# Patient Record
Sex: Female | Born: 1945 | Race: White | Hispanic: No | Marital: Single | Smoking: Former smoker
Health system: Southern US, Community
[De-identification: ages and names within clinical notes are randomized; demographics above are authoritative.]

## PROBLEM LIST (undated history)

## (undated) DIAGNOSIS — K219 Gastro-esophageal reflux disease without esophagitis: Secondary | ICD-10-CM

## (undated) DIAGNOSIS — R911 Solitary pulmonary nodule: Secondary | ICD-10-CM

## (undated) DIAGNOSIS — E119 Type 2 diabetes mellitus without complications: Secondary | ICD-10-CM

## (undated) DIAGNOSIS — J45909 Unspecified asthma, uncomplicated: Secondary | ICD-10-CM

## (undated) DIAGNOSIS — M069 Rheumatoid arthritis, unspecified: Secondary | ICD-10-CM

## (undated) DIAGNOSIS — I10 Essential (primary) hypertension: Secondary | ICD-10-CM

## (undated) HISTORY — PX: ROTATOR CUFF REPAIR: SHX139

---

## 2003-08-10 ENCOUNTER — Inpatient Hospital Stay (HOSPITAL_COMMUNITY): Admission: RE | Admit: 2003-08-10 | Discharge: 2003-08-15 | Payer: Self-pay | Admitting: Neurosurgery

## 2003-11-24 ENCOUNTER — Encounter: Admission: RE | Admit: 2003-11-24 | Discharge: 2003-11-24 | Payer: Self-pay | Admitting: Neurosurgery

## 2004-01-24 ENCOUNTER — Encounter: Admission: RE | Admit: 2004-01-24 | Discharge: 2004-01-24 | Payer: Self-pay | Admitting: Neurosurgery

## 2004-07-17 ENCOUNTER — Ambulatory Visit (HOSPITAL_COMMUNITY): Admission: RE | Admit: 2004-07-17 | Discharge: 2004-07-17 | Payer: Self-pay | Admitting: Allergy

## 2005-02-14 IMAGING — CR DG CHEST 2V
2 series · 2 of 2 positions shown · non-contrast
Comparison: 08/04/03.

CLINICAL DATA: Nonproductive cough. 
 PA AND LATERAL CHEST:

[view not recorded (1 of 2)]
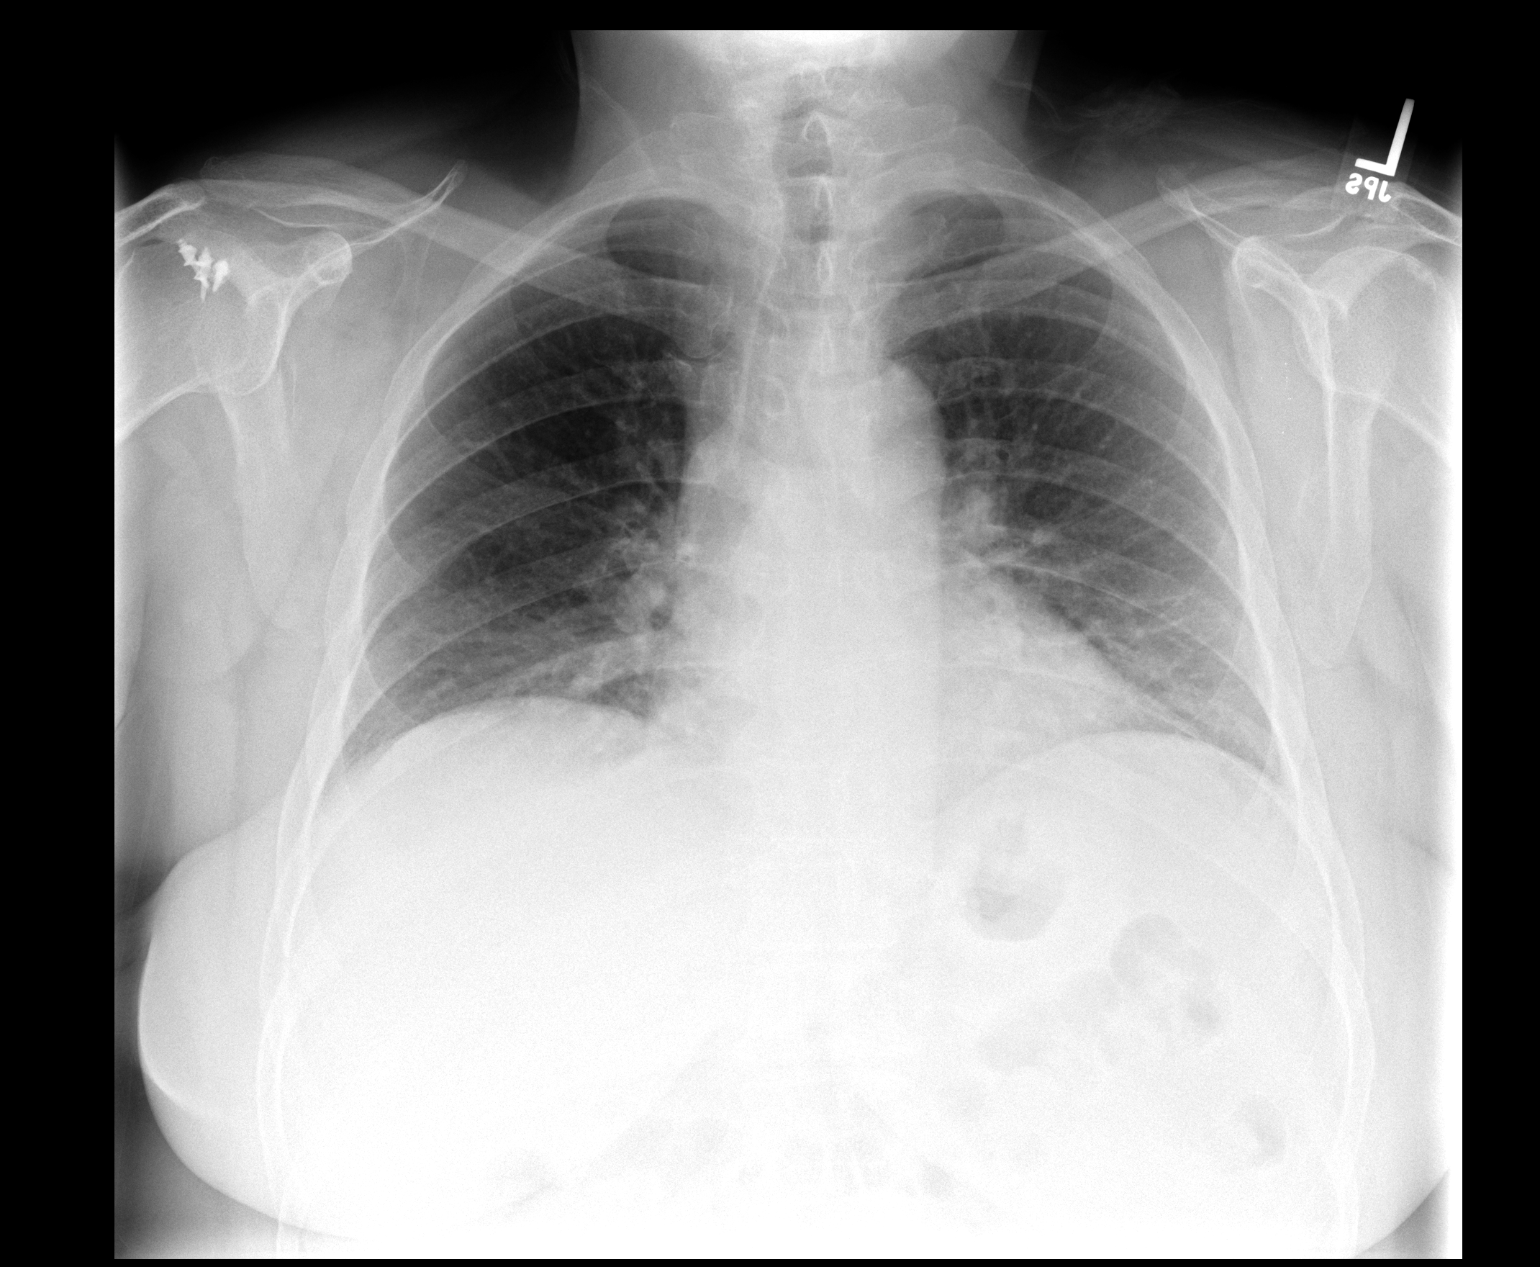

[view not recorded (2 of 2)]
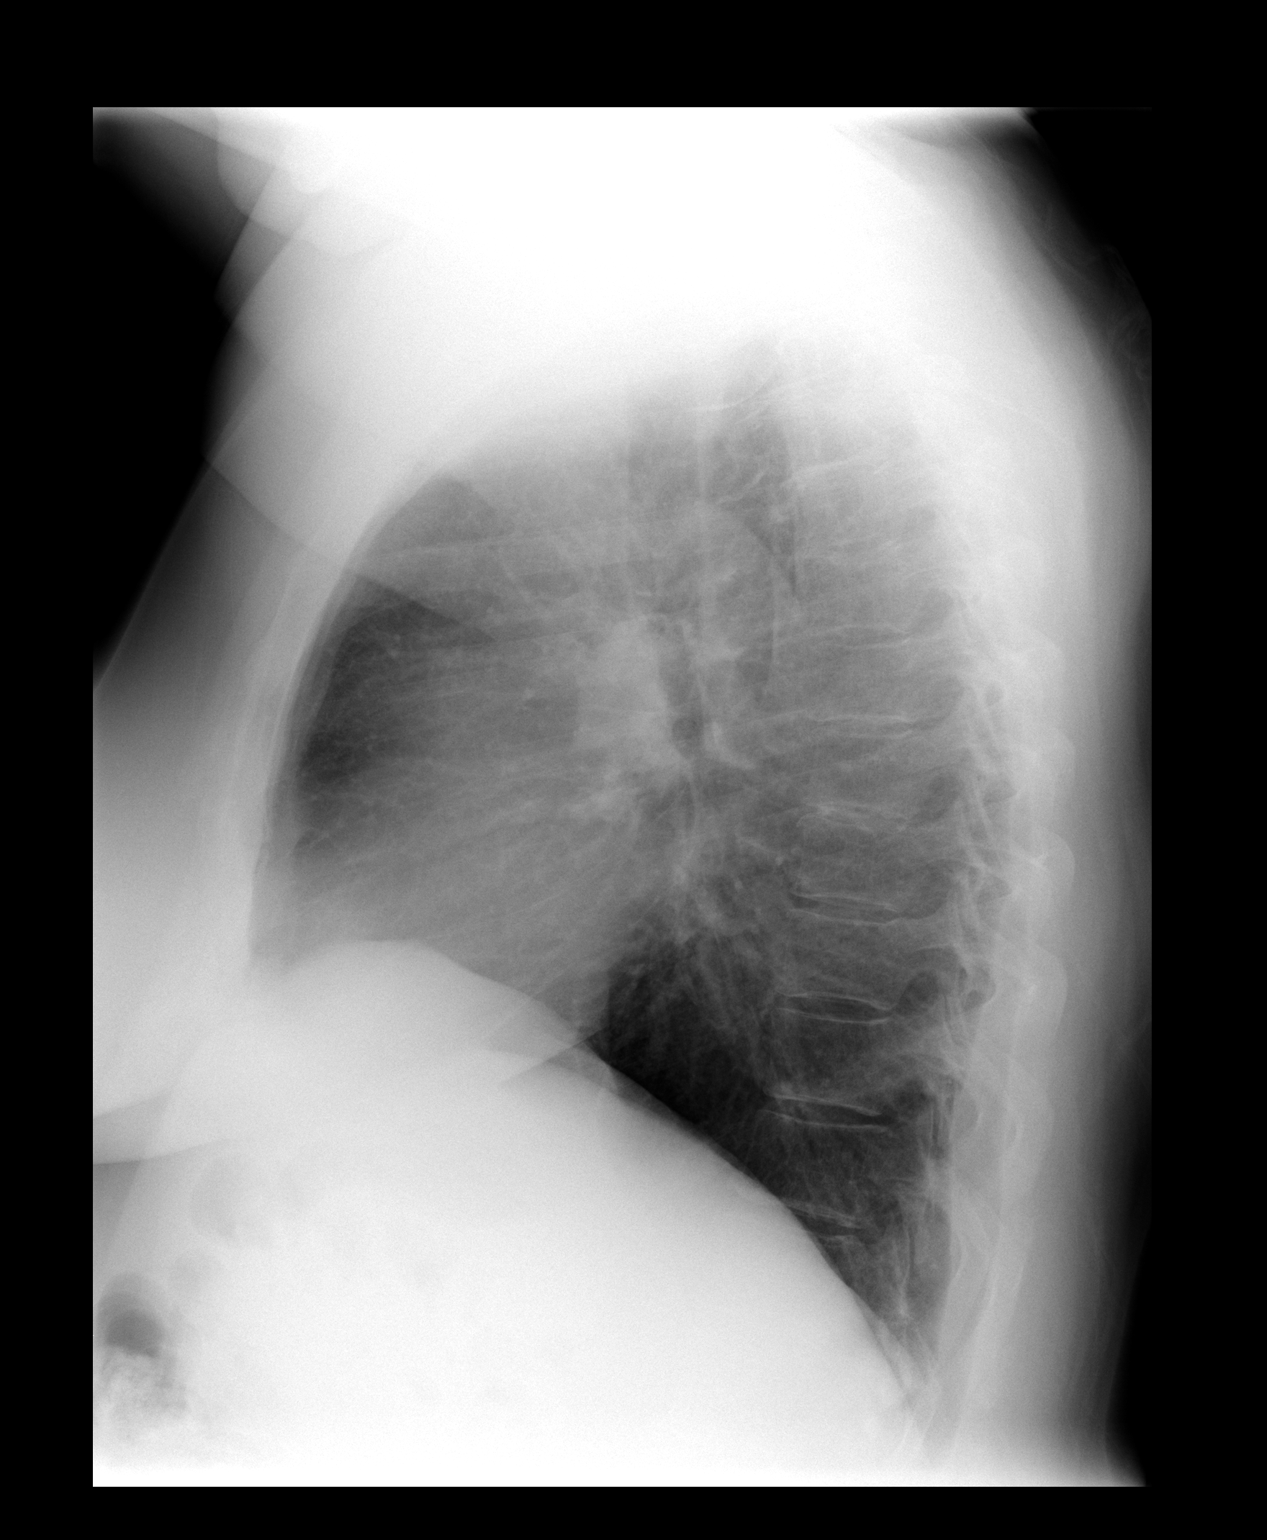

[2 of 2 positions shown; findings below may reference images not displayed]

FINDINGS: Lung volumes are low with some dependent bibasilar atelectasis.  No focal airspace disease.  No pleural effusion.  Heart size normal.
IMPRESSION: Negative exam.

## 2005-07-01 HISTORY — PX: BREAST LUMPECTOMY: SHX2

## 2005-09-04 ENCOUNTER — Ambulatory Visit: Payer: Self-pay | Admitting: Pulmonary Disease

## 2005-09-04 ENCOUNTER — Encounter: Payer: Self-pay | Admitting: Pulmonary Disease

## 2005-10-16 ENCOUNTER — Ambulatory Visit: Payer: Self-pay | Admitting: Pulmonary Disease

## 2005-11-15 ENCOUNTER — Ambulatory Visit (HOSPITAL_BASED_OUTPATIENT_CLINIC_OR_DEPARTMENT_OTHER): Admission: RE | Admit: 2005-11-15 | Discharge: 2005-11-15 | Payer: Self-pay | Admitting: Pulmonary Disease

## 2005-11-15 ENCOUNTER — Encounter: Payer: Self-pay | Admitting: Pulmonary Disease

## 2005-12-04 ENCOUNTER — Ambulatory Visit: Payer: Self-pay | Admitting: Pulmonary Disease

## 2005-12-10 ENCOUNTER — Ambulatory Visit: Payer: Self-pay | Admitting: Pulmonary Disease

## 2006-06-19 ENCOUNTER — Ambulatory Visit: Payer: Self-pay | Admitting: Pulmonary Disease

## 2006-10-14 ENCOUNTER — Encounter: Admission: RE | Admit: 2006-10-14 | Discharge: 2006-10-14 | Payer: Self-pay | Admitting: Family Medicine

## 2007-06-01 ENCOUNTER — Ambulatory Visit: Payer: Self-pay | Admitting: Pulmonary Disease

## 2007-06-03 DIAGNOSIS — I10 Essential (primary) hypertension: Secondary | ICD-10-CM

## 2007-06-03 DIAGNOSIS — G4733 Obstructive sleep apnea (adult) (pediatric): Secondary | ICD-10-CM | POA: Insufficient documentation

## 2007-06-03 DIAGNOSIS — K219 Gastro-esophageal reflux disease without esophagitis: Secondary | ICD-10-CM

## 2007-06-03 DIAGNOSIS — R05 Cough: Secondary | ICD-10-CM

## 2007-06-15 ENCOUNTER — Ambulatory Visit: Payer: Self-pay | Admitting: Pulmonary Disease

## 2007-06-30 ENCOUNTER — Ambulatory Visit: Payer: Self-pay | Admitting: Pulmonary Disease

## 2007-06-30 ENCOUNTER — Ambulatory Visit: Payer: Self-pay | Admitting: Internal Medicine

## 2007-07-03 ENCOUNTER — Telehealth (INDEPENDENT_AMBULATORY_CARE_PROVIDER_SITE_OTHER): Payer: Self-pay | Admitting: *Deleted

## 2007-07-09 ENCOUNTER — Telehealth (INDEPENDENT_AMBULATORY_CARE_PROVIDER_SITE_OTHER): Payer: Self-pay | Admitting: *Deleted

## 2007-07-14 ENCOUNTER — Ambulatory Visit: Payer: Self-pay | Admitting: Pulmonary Disease

## 2007-07-27 ENCOUNTER — Telehealth (INDEPENDENT_AMBULATORY_CARE_PROVIDER_SITE_OTHER): Payer: Self-pay | Admitting: *Deleted

## 2007-08-10 ENCOUNTER — Telehealth (INDEPENDENT_AMBULATORY_CARE_PROVIDER_SITE_OTHER): Payer: Self-pay | Admitting: *Deleted

## 2007-09-16 ENCOUNTER — Ambulatory Visit: Payer: Self-pay | Admitting: Critical Care Medicine

## 2007-09-18 ENCOUNTER — Encounter: Payer: Self-pay | Admitting: Critical Care Medicine

## 2007-10-16 ENCOUNTER — Ambulatory Visit: Payer: Self-pay | Admitting: Pulmonary Disease

## 2010-11-13 NOTE — Assessment & Plan Note (Signed)
[facility]                             PULMONARY OFFICE NOTE   Megan Golden, Megan Golden                       MRN:          [id number]  DATE:06/01/2007                            DOB:          Oct 23, 1945    HISTORY OF PRESENT ILLNESS:  The patient is a 65 year old white female  patient previously of Dr. Jayme Cloud who has a history of obstructive  sleep apnea and allergic rhinitis and cyclical cough presents today for  an acute office visit.  The patient complains over the last 3 weeks she  has had a recurrent cough, congestion, wheezing.  The patient was  initially seen at [facility], prescribed antibiotic and Tessalon Perles.  The patient did not fill her antibiotic due to a previous allergy to  chloroquinolones.  The patient reports that her symptoms slowly  improved, however, did not totally resolve.  She was subsequently seen  by Dr. [place] Callas who gave her a prednisone taper and a Depo-Medrol  injection one week ago.  Her symptoms did improve, but once she tapered  off her prednisone the symptoms started to return.  The patient  complains that she continues to have a minimally productive cough with  thick mucus, intermittent wheezing, and severe coughing paroxysms.  The  cough is worse in the daytime with talking and activity.  The patient  denies any hemoptysis, orthopnea, PND, or leg swelling.   PAST MEDICAL HISTORY:  Reviewed.   CURRENT MEDICATIONS:  Reviewed.   PHYSICAL EXAMINATION:  GENERAL:  The patient is a pleasant female in no  acute distress.  VITAL SIGNS:  She is afebrile with stable vital signs.  O2 saturation is  96% on room air.  HEENT:  Nasal mucosa with some mild erythema.  Nontender sinuses.  NECK:  Supple without cervical adenopathy.  No JVD.  RESPIRATORY:  Lung sounds are coarse breath sounds without any wheezing  or crackles.  CARDIAC:  Regular rate and rhythm.  ABDOMEN:  Soft and nontender.  EXTREMITIES:  Warm without any  edema.   Chest x-ray is pending at time of dictation.   IMPRESSION:  Slow-to-resolve tracheal bronchitis.   PLAN:  The patient is to begin Omnicef x7 days, Mucinex DM twice a day,  Tussionex #4 ounces at 1/2 teaspoon to 1 teaspoon every 12 hours as  needed for cough.  The patient is  aware of the sedating effect.  The patient is to return back to Dr.  Shelle Iron in 2 weeks or sooner if needed.      Rubye Oaks, NP  Electronically Signed      Barbaraann Share, MD,FCCP  Electronically Signed   TP/MedQ  DD: 06/01/2007  DT: 06/01/2007  Job #: [id number]

## 2010-11-16 NOTE — Discharge Summary (Signed)
NAMEEDIT, RICCIARDELLI                          ACCOUNT NO.:  [id number]   MEDICAL RECORD NO.:  [id number]                   PATIENT TYPE:  INP   LOCATION:  3008                                 FACILITY:  [facility]   PHYSICIAN:  Donalee Citrin, M.D.                     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   PROCEDURE PERFORMED:  Posterior lumbar interbody fusion, L4-5.   HOSPITAL COURSE:  The patient was admitted as an a.m. admit and went to the  operating room and underwent the aforementioned procedure.  Postoperatively,  she did very well, recovered on the floor and on the floor, was completely  afebrile with no leg pain and 5/5 strength.  She was kept flat due to  intraoperative dural leak; this was maintained flat for the 3 days  postoperatively on the third day, the patient was progressively mobilized.  On immobilization, she was having no postural headaches and no leg pain and  was subsequently discharged home on the 14th.  At the time of discharge, the  patient did not need home health and was discharged with a rolling walker  and a commode seat.  She was ambulating and voiding spontaneously and set up  for followup in 2 weeks.                                                Donalee Citrin, M.D.    GC/MEDQ  D:  08/31/2003  T:  08/31/2003  Job:  [id number]

## 2010-11-16 NOTE — Procedures (Signed)
NAMEJAVAYAH, Megan Golden NO.:  [id number]   MEDICAL RECORD NO.:  [id number]          PATIENT TYPE:  OUT   LOCATION:  SLEEP CENTER                 FACILITY:  [facility]   PHYSICIAN:  Marcelyn Bruins, M.D. [facility] DATE OF BIRTH:  1945/11/17   DATE OF STUDY:  11/15/2005                              NOCTURNAL POLYSOMNOGRAM    .  REFERRING PHYSICIAN:  Dr. Danice Goltz   INDICATIONS FOR STUDY:  Hypersomnia with sleep apnea.   EPWORTH SCORE:  24.   SLEEP ARCHITECTURE:  The patient had a total sleep time of 404 minutes with  adequate slow wave sleep as well as REM.  Sleep onset latency was mildly  prolonged at 39 minutes and REM onset was normal of 69 minutes.  Sleep  efficiency was 88%.   RESPIRATORY DATA:  The patient was found to have 56 hypopneas and 75 apneas  for a respiratory disturbance index of 20 events per hour.  The events  occurred almost exclusively during REM and also were associated with  moderate snoring.  The events were not positional in nature.   OXYGEN DATA:  There was O2 desaturation as low as 81% with the patient's  obstructive events.   CARDIAC DATA:  Cardiac decelerations and accelerations were noted with the  patient's obstructive events; however, there were no clinically significant  cardiac arrhythmias noted.   MOVEMENT/PARASOMNIA:  The patient was found to have 246 leg jerks with  almost seven per hour resulting in arousal or awakening.   IMPRESSION/RECOMMENDATIONS:  1.  Moderate obstructive sleep apnea/hypopnea syndrome with a respiratory      disturbance index of 20 events per hour and O2 desaturation as low as      81%.  The events occurred almost exclusively during REM.  Treatment for      this degree of sleep apnea may include weight loss alone if applicable,      possibly REM suppressants, upper airway surgery, oral appliance, and      also CPAP.  Clinical correlation is suggested.  2.  Cardiac decelerations and accelerations associated  with the patient's      obstructive events.  3.  Very large numbers of leg jerks with what appears to be significant      sleep disruption.  It is unclear from this study whether the leg      movements are related to the patient's sleep disordered breathing or      whether the patient      could have an additional primary movement disorder of sleep.  Clinical      correlation is suggested after appropriate treatment of the patient's      obstructive sleep apnea.           ______________________________  Marcelyn Bruins, M.D. [facility]  Diplomate, American Board of Sleep  Medicine     KC/MEDQ  D:  12/11/2005 07:53:49  T:  12/11/2005 16:10:96  Job:  [id number]

## 2010-11-16 NOTE — Op Note (Signed)
Megan Golden, HATLER                          ACCOUNT NO.:  [id number]   MEDICAL RECORD NO.:  [id number]                   PATIENT TYPE:   LOCATION:  2899                                 FACILITY:  [facility]   PHYSICIAN:  Donalee Citrin, M.D.                     DATE OF BIRTH:  12-Jun-1946   DATE OF PROCEDURE:  08/10/2003  DATE OF DISCHARGE:                                 OPERATIVE REPORT   PREOPERATIVE DIAGNOSIS:  Severe lumbar spinal stenosis L4-5 from  degenerative lumbar spondylolisthesis, L4-5 facet arthropathy and ruptured  disk L4-5.   PROCEDURE:  Decompressive laminectomy L4-5, posterior lumbar fusion L4-5  using 10 x 26 mm Tangent allograft wedges, pedicle screw fixation L4-5 using  the M10 CD [employer] system, posterolateral arthrodesis and locally  harvested autograft L4-5.   SURGEON:  Donalee Citrin, M.D.   ASSISTANT:  Tia Alert, M.D.   ANESTHESIA:  General endotracheal.   HISTORY OF PRESENT ILLNESS:  The patient is a very pleasant 65 year old  female who has had longstanding back and bilateral leg pain, worse on the  right, radiating down to her top of foot and big toe as well as the anterior  shin with numbness, tingling in the same distribution.  The patient has been  refractory to all forms of conservative treatment.  Previous films showed a  complete myelographic block at L4-5 due to spinal stenosis,  spondylolisthesis degenerative in nature, facet arthropathy and a ruptured  disk.  Due to the patient's failure to conservative treatment and her degree  of spinal stenosis and her back and leg pain the patient was recommended a  radical decompressive stabilization procedure.  After discussion of the  risks and benefits of surgery with her, she understood and agrees to  proceed.   DESCRIPTION OF PROCEDURE:  The patient was brought to the OR where she  received general anesthesia.  She was placed prone on the Wilson frame and  the back was prepped and draped in  usual sterile fashion.  Preoperative x-  ray localized the L4-5 disk space.  A midline incision was made and Bovie  electrocautery was used to dissect the subcuticular tissues.  Subperiosteal  dissection was carried out to lamina of L3, 4 and 5 bilaterally.  Intraoperative fluoroscopy confirmed localization of the L4 pedicle.  The L4  TPs were dissected free and self-retaining retractor was placed.  Then using  the Leksell rongeurs the spinous process of L4 was removed.  There was noted  to be severe facet arthropathy and spinal stenosis upon decompressive  laminectomy.  The ligament was noted to be markedly hypertrophied.  The  facets were noted to be overgrown and creating hourglass compression on the  thecal sac.  After the superior aspect of the L4 lamina had been removed and  the L4 nerve root on the right was identified, this was radically  decompressed at  its foramen.  While taking a 4 Penfield and dissecting the  hypertrophied ligament and facet complex off of the thecal sac, a dural rent  was achieved and a small little nerve rootlet hernia through the dural rent.  This was packed back into the dural rent and packed away with cottonoid.  Then attempts were taken to get around it with completing the decompressive  laminectomy on the patient's left side.  The left side L4 nerve root was  identified.  The complete medial facet complex at L4-5 on the left was  removed and the L4 nerve root was radically decompressed at its foramen.  The hypertrophied ligament and facet complex which had been noted to be  markedly stenotic as well on the left side were dissected free without dural  rent.  Then the superior aspect of the L5 lamina was removed then marched  back over to the right side.  After the L5 nerve root on the right was  identified at the pedicle it was noted to be severely stenotic up against  the pedicle.  This was dissected free with a 4 Penfield and this was worked  back  towards the site of the dural laceration.  After the facet complex had  been freed up and removed and we could see around all sides the dural rent,  it was oversewn with a 6-0 Prolene taking care to keep the rootlets packed  within the thecal sac and getting a good watertight closure with Valsalva.  Then the interspace was identified.  The disk was dissected off of the L5  nerve root and was noted to be markedly herniated and stenotic on the right  L5 nerve root.  This was felt to be consistent with pathology as well as the  severe facet arthropathy that had been compressing the fiber root and stuck  and partially eroded the dura.   Then on the left side, the interspace was identified and the epidural veins  were coagulated.  First a distractor was inserted on the left side.  The  interspace was noted to be severely collapsed and the L4 vertebral body  anterolisthesed on L5.  It took a small size cutter to break off the  posterior osteophyte in order to enable a size 10 distractor to get in  there.  Then a size 10 distractor had good apposition with the endplates,  confirmed by fluoroscopy, and the right side was then appreciated with a  D'Errico nerve retractor.  The right L5 nerve root was reflected medially  taking care to not put too much tension on the dural repair then the  interspace was incised with an 11 blade scalpel and pituitary rongeurs were  able to remove the disk space.  The disk space was prepared.  The bone graft  was a size 8 first, then 10 cutter and then size 10 chisel was used.  Fluoroscopy confirmed good depth and trajectory each step along the way.  Then a 10 x 26 mm Tangent allograft was inserted on the right side.  Then  this was noted to be 1-2 mm deep to the posterior vertebral body line.  The  distractor was removed.  Attention directed to the left side.  Disk spaces were radically cleaned out.  Several large fragments of disk were removed  from the central  compartment and the disk space was cleaned out.  There were  several herniated fragments that predominantly were central and rightward on  the left side as  well as lateral.  After the endplates were scraped with a  size 10 cutter and chisel, lateral disk removed and then central endplates  scraped with a downgoing Epstein curette, locally harvested autograft was  packed against the right side allograft and left side allograft was inserted  again 1-2 mm deep to the posterior vertebral body line, each step along the  way confirmed with fluoroscopy.  After both allografts were inserted  fluoroscopy confirmed good depth and position.   Attention directed to pedicle placement.  The L4 pedicle on the left was  identified using fluoroscopy and landmarks within the canal as well as the  transverse process drill was used to drill a pilot hole, cannulated with the  awl, tapped with a 5-5 tap, probed and a 6.5 x 45 pedicle screw inserted L4  on the left.  The pedicle was noted to be competent 360 degree rotation from  within the pedicle probing as well as within the canal.  No medial breach  was appreciated.  The L5 on the left pedicle screw was inserted in a similar  fashion as well as L4-5 on the right.  After all four pedicle screws were  inserted the wound was copiously irrigated then aggressive decortication was  carried out of the TPs and lateral gutters of L4-5 on the right and left  side.  The remainder of the locally harvested autograft was packed in the  lateral gutters then 30 mm rod was size selected and top-loading nuts were  tightening at L5 and the L4 pedicle screws compressed against L5.  The canal  was inspected.  All four nerve roots were inspected noting no further  stenosis and no graft material had migrated from the canal.  However, during  this inspection it was noted there was a small split thickness dural rent in  the inferior aspect of the L5 nerve root.  This was oversewn  again with a 6-  0 Prolene and then Duragen was used to wrap along the undersurface of the  fiber root over the top of each repair site.  Then Tisseel was overlaid on  top of this and the retractor was removed.  Meticulous hemostasis was  maintained.  The muscle and fascia reapproximated with 0 interrupted Vicryl,  subcutaneous tissues closed with 2-0 interrupted Vicryl and skin closed with  running 4-0 subcuticular.  Benzoin and Steri-Strips were applied.  The  patient was recovered from anesthesia.  At the end of the case needle counts  were correct.                                               Donalee Citrin, M.D.    GC/MEDQ  D:  08/10/2003  T:  08/10/2003  Job:  [id number]

## 2011-09-17 DIAGNOSIS — M3500 Sicca syndrome, unspecified: Secondary | ICD-10-CM | POA: Insufficient documentation

## 2011-09-17 DIAGNOSIS — H16229 Keratoconjunctivitis sicca, not specified as Sjogren's, unspecified eye: Secondary | ICD-10-CM | POA: Insufficient documentation

## 2011-09-17 DIAGNOSIS — C50919 Malignant neoplasm of unspecified site of unspecified female breast: Secondary | ICD-10-CM | POA: Insufficient documentation

## 2012-10-07 DIAGNOSIS — R0602 Shortness of breath: Secondary | ICD-10-CM | POA: Insufficient documentation

## 2014-02-28 DIAGNOSIS — M65849 Other synovitis and tenosynovitis, unspecified hand: Secondary | ICD-10-CM

## 2014-02-28 DIAGNOSIS — M19039 Primary osteoarthritis, unspecified wrist: Secondary | ICD-10-CM | POA: Insufficient documentation

## 2014-02-28 DIAGNOSIS — M65839 Other synovitis and tenosynovitis, unspecified forearm: Secondary | ICD-10-CM | POA: Insufficient documentation

## 2014-06-06 DIAGNOSIS — K219 Gastro-esophageal reflux disease without esophagitis: Secondary | ICD-10-CM | POA: Insufficient documentation

## 2015-03-23 DIAGNOSIS — M19022 Primary osteoarthritis, left elbow: Secondary | ICD-10-CM | POA: Insufficient documentation

## 2015-08-09 DIAGNOSIS — E119 Type 2 diabetes mellitus without complications: Secondary | ICD-10-CM | POA: Insufficient documentation

## 2015-08-11 ENCOUNTER — Other Ambulatory Visit: Payer: Self-pay | Admitting: Internal Medicine

## 2015-08-11 DIAGNOSIS — Z853 Personal history of malignant neoplasm of breast: Secondary | ICD-10-CM

## 2016-01-17 DIAGNOSIS — M159 Polyosteoarthritis, unspecified: Secondary | ICD-10-CM | POA: Insufficient documentation

## 2016-02-08 DIAGNOSIS — J309 Allergic rhinitis, unspecified: Secondary | ICD-10-CM | POA: Insufficient documentation

## 2016-02-19 DIAGNOSIS — M21619 Bunion of unspecified foot: Secondary | ICD-10-CM | POA: Insufficient documentation

## 2016-07-14 DIAGNOSIS — Z4789 Encounter for other orthopedic aftercare: Secondary | ICD-10-CM | POA: Insufficient documentation

## 2016-08-09 DIAGNOSIS — Z853 Personal history of malignant neoplasm of breast: Secondary | ICD-10-CM | POA: Insufficient documentation

## 2016-08-09 DIAGNOSIS — J4541 Moderate persistent asthma with (acute) exacerbation: Secondary | ICD-10-CM | POA: Insufficient documentation

## 2016-11-07 DIAGNOSIS — M549 Dorsalgia, unspecified: Secondary | ICD-10-CM

## 2016-11-07 DIAGNOSIS — G8929 Other chronic pain: Secondary | ICD-10-CM | POA: Insufficient documentation

## 2016-11-15 DIAGNOSIS — M810 Age-related osteoporosis without current pathological fracture: Secondary | ICD-10-CM | POA: Insufficient documentation

## 2016-12-18 DIAGNOSIS — E119 Type 2 diabetes mellitus without complications: Secondary | ICD-10-CM | POA: Insufficient documentation

## 2016-12-24 DIAGNOSIS — R911 Solitary pulmonary nodule: Secondary | ICD-10-CM | POA: Insufficient documentation

## 2017-02-19 DIAGNOSIS — M19041 Primary osteoarthritis, right hand: Secondary | ICD-10-CM | POA: Insufficient documentation

## 2017-05-12 DIAGNOSIS — B370 Candidal stomatitis: Secondary | ICD-10-CM | POA: Insufficient documentation

## 2017-05-12 DIAGNOSIS — H6983 Other specified disorders of Eustachian tube, bilateral: Secondary | ICD-10-CM | POA: Insufficient documentation

## 2017-07-04 DIAGNOSIS — R600 Localized edema: Secondary | ICD-10-CM | POA: Insufficient documentation

## 2017-10-23 DIAGNOSIS — M47812 Spondylosis without myelopathy or radiculopathy, cervical region: Secondary | ICD-10-CM | POA: Insufficient documentation

## 2017-11-11 DIAGNOSIS — E790 Hyperuricemia without signs of inflammatory arthritis and tophaceous disease: Secondary | ICD-10-CM | POA: Insufficient documentation

## 2017-11-11 DIAGNOSIS — M05742 Rheumatoid arthritis with rheumatoid factor of left hand without organ or systems involvement: Secondary | ICD-10-CM | POA: Insufficient documentation

## 2017-12-29 DIAGNOSIS — H43393 Other vitreous opacities, bilateral: Secondary | ICD-10-CM | POA: Insufficient documentation

## 2017-12-29 DIAGNOSIS — H43813 Vitreous degeneration, bilateral: Secondary | ICD-10-CM | POA: Insufficient documentation

## 2018-02-10 DIAGNOSIS — E1142 Type 2 diabetes mellitus with diabetic polyneuropathy: Secondary | ICD-10-CM | POA: Insufficient documentation

## 2018-03-19 ENCOUNTER — Other Ambulatory Visit: Payer: Self-pay

## 2018-03-19 ENCOUNTER — Encounter: Payer: Self-pay | Admitting: Podiatry

## 2018-03-19 ENCOUNTER — Ambulatory Visit (INDEPENDENT_AMBULATORY_CARE_PROVIDER_SITE_OTHER): Payer: Medicare Other

## 2018-03-19 ENCOUNTER — Ambulatory Visit: Payer: Medicare Other | Admitting: Podiatry

## 2018-03-19 VITALS — BP 145/71 | HR 82

## 2018-03-19 DIAGNOSIS — M2012 Hallux valgus (acquired), left foot: Secondary | ICD-10-CM

## 2018-03-19 DIAGNOSIS — E1142 Type 2 diabetes mellitus with diabetic polyneuropathy: Secondary | ICD-10-CM | POA: Diagnosis not present

## 2018-03-19 DIAGNOSIS — M2011 Hallux valgus (acquired), right foot: Secondary | ICD-10-CM | POA: Diagnosis not present

## 2018-03-19 DIAGNOSIS — M722 Plantar fascial fibromatosis: Secondary | ICD-10-CM

## 2018-03-19 DIAGNOSIS — E119 Type 2 diabetes mellitus without complications: Secondary | ICD-10-CM

## 2018-03-19 DIAGNOSIS — M2041 Other hammer toe(s) (acquired), right foot: Secondary | ICD-10-CM | POA: Diagnosis not present

## 2018-03-19 DIAGNOSIS — M2042 Other hammer toe(s) (acquired), left foot: Secondary | ICD-10-CM

## 2018-03-19 DIAGNOSIS — M792 Neuralgia and neuritis, unspecified: Secondary | ICD-10-CM

## 2018-03-19 DIAGNOSIS — E1140 Type 2 diabetes mellitus with diabetic neuropathy, unspecified: Secondary | ICD-10-CM

## 2018-03-19 MED ORDER — NONFORMULARY OR COMPOUNDED ITEM: 1.0000 g | Freq: Four times a day (QID) | 2 refills | Status: DC

## 2018-03-19 NOTE — Progress Notes (Signed)
Subjective:  Patient ID: Megan Golden, female    DOB: 13-Mar-1946,  MRN: [id number]  Chief Complaint  Patient presents with  . Foot Pain    B/L feet pain with the left worse since 1 year ago    72 y.o. female presents  for diabetic foot care. Last AMBS was 130s. Reports pain in both feet. Tips of the toes to both feet and to the left foot bunion. Hx of bunion correction left but states the area still bothers her when she wears shoes. PCP Dr. Hillery Aldo. Has tried inserts and wide shoes without relief.  Reports numbness and tingling in their feet. Denies cramping in legs and thighs.  Review of Systems: Negative except as noted in the HPI. Denies N/V/F/Ch.  No past medical history on file.  Current Outpatient Medications:  .  Abaloparatide 3120 MCG/1.56ML SOPN, Inject into the skin., Disp: , Rfl:  .  albuterol (PROVENTIL HFA;VENTOLIN HFA) 108 (90 Base) MCG/ACT inhaler, Inhale into the lungs., Disp: , Rfl:  .  allopurinol (ZYLOPRIM) 100 MG tablet, , Disp: , Rfl:  .  Ascorbic Acid (VITAMIN C PO), Take by mouth., Disp: , Rfl:  .  aspirin EC 81 MG tablet, Take 81 mg by mouth daily., Disp: , Rfl:  .  Azelastine-Fluticasone 137-50 MCG/ACT SUSP, Place into the nose., Disp: , Rfl:  .  b complex vitamins capsule, Take 1 capsule by mouth daily., Disp: , Rfl:  .  BEE POLLEN PO, Take 580 mg by mouth daily., Disp: , Rfl:  .  Calcium-Magnesium-Vitamin D (CALCIUM MAGNESIUM PO), Take by mouth. Plus [phone] mg-unit, Disp: , Rfl:  .  Colchicine 0.6 MG CAPS, Take 1 capsule by mouth daily., Disp: , Rfl: 0 .  Cyanocobalamin (VITAMIN B-12 PO), Take by mouth., Disp: , Rfl:  .  doxycycline (VIBRA-TABS) 100 MG tablet, Take 100 mg by mouth 2 (two) times daily., Disp: , Rfl: 0 .  fexofenadine (ALLEGRA) 180 MG tablet, Take by mouth., Disp: , Rfl:  .  Flaxseed, Linseed, (FLAXSEED OIL) 1000 MG CAPS, Take by mouth., Disp: , Rfl:  .  fluticasone (FLONASE) 50 MCG/ACT nasal spray, INSTILL 1 SPRAY IN EACH NOSTRIL  EVERY DAY AS NEEDED, Disp: , Rfl:  .  Fluticasone Furoate (ARNUITY ELLIPTA) 100 MCG/ACT AEPB, Inhale into the lungs., Disp: , Rfl:  .  folic acid (FOLVITE) 1 MG tablet, Take 1 mg by mouth daily., Disp: , Rfl: 0 .  furosemide (LASIX) 20 MG tablet, Take 20 mg by mouth 2 (two) times daily., Disp: , Rfl: 5 .  gabapentin (NEURONTIN) 400 MG capsule, , Disp: , Rfl:  .  glimepiride (AMARYL) 1 MG tablet, TAKE ONE TABLET BY MOUTH EVERY MORNING BEFORE BREAKFAST, Disp: , Rfl:  .  glucose blood (ONETOUCH VERIO) test strip, USE TO CHECK BLOOD SUGAR EVERY DAY, Disp: , Rfl:  .  hydroxychloroquine (PLAQUENIL) 200 MG tablet, , Disp: , Rfl:  .  losartan-hydrochlorothiazide (HYZAAR) 100-12.5 MG tablet, Take 1 tablet by mouth at bedtime., Disp: , Rfl: 2 .  metFORMIN (GLUCOPHAGE-XR) 500 MG 24 hr tablet, , Disp: , Rfl:  .  methotrexate (RHEUMATREX) 2.5 MG tablet, TAKE 4 TABLETS BY MOUTH ONCE WEEKLY, Disp: , Rfl: 0 .  metoprolol succinate (TOPROL-XL) 50 MG 24 hr tablet, TAKE ONE TABLET BY MOUTH EVERY DAY, Disp: , Rfl:  .  montelukast (SINGULAIR) 10 MG tablet, , Disp: , Rfl:  .  Multiple Vitamin (MULTIVITAMIN) capsule, Take by mouth., Disp: , Rfl:  .  omeprazole (PRILOSEC) 20  MG capsule, Take 40 mg by mouth 2 (two) times daily., Disp: , Rfl: 5 .  potassium chloride (K-DUR) 10 MEQ tablet, , Disp: , Rfl:  .  sodium chloride (OCEAN NASAL SPRAY) 0.65 % nasal spray, Place 1 spray into the nose as needed for congestion., Disp: , Rfl:  .  triamcinolone cream (KENALOG) 0.1 %, Apply 1 application topically 2 (two) times daily., Disp: , Rfl:   Social History   Tobacco Use  Smoking Status Former Smoker  Smokeless Tobacco Never Used    Allergies  Allergen Reactions  . Azithromycin Golden and Rash  . Ciprofloxacin Golden and Rash  . Hydrocodone-Acetaminophen Golden and Rash  . Levofloxacin Golden and Rash  . Hydrocodone-Acetaminophen    Objective:   Vitals:   03/19/18 0920  BP: (!) 145/71  Pulse: 82   There is no  height or weight on file to calculate BMI. Constitutional Well developed. Well nourished.  Vascular Dorsalis pedis pulses present 2+ bilaterally  Posterior tibial pulses absent bilaterally  Pedal hair growth diminished. Capillary refill normal to all digits.  No cyanosis or clubbing noted.  Neurologic Normal speech. Oriented to person, place, and time. Epicritic sensation to light touch grossly present bilaterally. Protective sensation with 5.07 monofilament  absent right, diminished 4/5 Left Vibratory sensation diminished bilaterally.  Dermatologic Nails normal length today No open wounds. No skin lesions.  Orthopedic: Normal joint ROM without pain or crepitus bilaterally. HAV bilat (R>L), digital contractures bilat. No bony tenderness.   Assessment:   1. Acquired hallux valgus of both feet   2. Hammer toes of both feet   3. DM type 2 with diabetic peripheral neuropathy ([place])   4. Encounter for diabetic foot exam ([place])   5. Neuralgia and neuritis   6. Diabetic neuropathy, painful ([place])    Plan:  Patient was evaluated and treated and all questions answered.  Diabetes with DPN, Onychomycosis -XR Reviewed. No acute fractures or dislocations. Hallux valgus right, evidence of hallux valgus correction left. Hammertoes bilat. -Educated on diabetic footcare. Diabetic risk level 1 -Will fabricate DM shoes. -Rx for compound neuropathic pain cream for symptomatic DPN -Nail care deferred today as patient just cut her nails.  Return in about 6 weeks (around 04/30/2018) for Neuritis f/u, Diabetic Foot Care.

## 2018-03-30 ENCOUNTER — Ambulatory Visit: Payer: Medicare Other | Admitting: Orthotics

## 2018-03-30 DIAGNOSIS — E119 Type 2 diabetes mellitus without complications: Secondary | ICD-10-CM

## 2018-03-30 DIAGNOSIS — M2012 Hallux valgus (acquired), left foot: Principal | ICD-10-CM

## 2018-03-30 DIAGNOSIS — M2011 Hallux valgus (acquired), right foot: Secondary | ICD-10-CM

## 2018-03-30 DIAGNOSIS — E1142 Type 2 diabetes mellitus with diabetic polyneuropathy: Secondary | ICD-10-CM

## 2018-03-30 NOTE — Progress Notes (Signed)

## 2018-04-27 ENCOUNTER — Ambulatory Visit (INDEPENDENT_AMBULATORY_CARE_PROVIDER_SITE_OTHER): Payer: Medicare Other | Admitting: Orthotics

## 2018-04-27 DIAGNOSIS — E119 Type 2 diabetes mellitus without complications: Secondary | ICD-10-CM

## 2018-04-27 DIAGNOSIS — M2011 Hallux valgus (acquired), right foot: Secondary | ICD-10-CM

## 2018-04-27 DIAGNOSIS — E1142 Type 2 diabetes mellitus with diabetic polyneuropathy: Secondary | ICD-10-CM | POA: Diagnosis not present

## 2018-04-27 DIAGNOSIS — M2041 Other hammer toe(s) (acquired), right foot: Secondary | ICD-10-CM

## 2018-04-27 DIAGNOSIS — M2012 Hallux valgus (acquired), left foot: Secondary | ICD-10-CM | POA: Diagnosis not present

## 2018-04-27 DIAGNOSIS — M2042 Other hammer toe(s) (acquired), left foot: Secondary | ICD-10-CM

## 2018-04-27 NOTE — Progress Notes (Signed)

## 2018-05-08 ENCOUNTER — Ambulatory Visit: Payer: Medicare Other | Admitting: Podiatry

## 2018-05-08 DIAGNOSIS — B351 Tinea unguium: Secondary | ICD-10-CM

## 2018-05-08 DIAGNOSIS — E1140 Type 2 diabetes mellitus with diabetic neuropathy, unspecified: Secondary | ICD-10-CM

## 2018-05-08 DIAGNOSIS — E1142 Type 2 diabetes mellitus with diabetic polyneuropathy: Secondary | ICD-10-CM

## 2018-05-08 DIAGNOSIS — L600 Ingrowing nail: Secondary | ICD-10-CM | POA: Diagnosis not present

## 2018-05-08 DIAGNOSIS — M79676 Pain in unspecified toe(s): Secondary | ICD-10-CM | POA: Diagnosis not present

## 2018-05-08 MED ORDER — NEOMYCIN-POLYMYXIN-HC 3.5-10000-1 OT SOLN: OTIC | 0 refills | Status: DC

## 2018-05-08 NOTE — Addendum Note (Signed)
Addended by: Hardie Pulley on: 05/08/2018 09:56 AM   Modules accepted: Level of Service

## 2018-05-08 NOTE — Patient Instructions (Signed)

## 2018-05-08 NOTE — Progress Notes (Addendum)
Subjective:  Patient ID: Megan Golden, female    DOB: 05/14/1946,  MRN: [id number]  Chief Complaint  Patient presents with  . Foot Pain    bilateral foot pain    72 y.o. female presents  for diabetic foot care. Nail care was deferred at last visit. Having pain in both toes from ingrown nails would like those treated today as well. Reports numbness and tingling in their feet. Denies cramping in legs and thighs.  Reports painful neuropathy didn't get the cream prescribed at last visit.  Review of Systems: Negative except as noted in the HPI. Denies N/V/F/Ch.  No past medical history on file.  Current Outpatient Medications:  .  Abaloparatide 3120 MCG/1.56ML SOPN, Inject into the skin., Disp: , Rfl:  .  albuterol (PROVENTIL HFA;VENTOLIN HFA) 108 (90 Base) MCG/ACT inhaler, Inhale into the lungs., Disp: , Rfl:  .  allopurinol (ZYLOPRIM) 100 MG tablet, , Disp: , Rfl:  .  Ascorbic Acid (VITAMIN C PO), Take by mouth., Disp: , Rfl:  .  aspirin EC 81 MG tablet, Take 81 mg by mouth daily., Disp: , Rfl:  .  Azelastine-Fluticasone 137-50 MCG/ACT SUSP, Place into the nose., Disp: , Rfl:  .  b complex vitamins capsule, Take 1 capsule by mouth daily., Disp: , Rfl:  .  BEE POLLEN PO, Take 580 mg by mouth daily., Disp: , Rfl:  .  Calcium-Magnesium-Vitamin D (CALCIUM MAGNESIUM PO), Take by mouth. Plus [phone] mg-unit, Disp: , Rfl:  .  Colchicine 0.6 MG CAPS, Take 1 capsule by mouth daily., Disp: , Rfl: 0 .  Cyanocobalamin (VITAMIN B-12 PO), Take by mouth., Disp: , Rfl:  .  doxycycline (VIBRA-TABS) 100 MG tablet, Take 100 mg by mouth 2 (two) times daily., Disp: , Rfl: 0 .  fexofenadine (ALLEGRA) 180 MG tablet, Take by mouth., Disp: , Rfl:  .  Flaxseed, Linseed, (FLAXSEED OIL) 1000 MG CAPS, Take by mouth., Disp: , Rfl:  .  fluticasone (FLONASE) 50 MCG/ACT nasal spray, INSTILL 1 SPRAY IN EACH NOSTRIL EVERY DAY AS NEEDED, Disp: , Rfl:  .  Fluticasone Furoate (ARNUITY ELLIPTA) 100 MCG/ACT AEPB,  Inhale into the lungs., Disp: , Rfl:  .  folic acid (FOLVITE) 1 MG tablet, Take 1 mg by mouth daily., Disp: , Rfl: 0 .  furosemide (LASIX) 20 MG tablet, Take 20 mg by mouth 2 (two) times daily., Disp: , Rfl: 5 .  gabapentin (NEURONTIN) 400 MG capsule, , Disp: , Rfl:  .  glimepiride (AMARYL) 1 MG tablet, TAKE ONE TABLET BY MOUTH EVERY MORNING BEFORE BREAKFAST, Disp: , Rfl:  .  glucose blood (ONETOUCH VERIO) test strip, USE TO CHECK BLOOD SUGAR EVERY DAY, Disp: , Rfl:  .  hydroxychloroquine (PLAQUENIL) 200 MG tablet, , Disp: , Rfl:  .  losartan-hydrochlorothiazide (HYZAAR) 100-12.5 MG tablet, Take 1 tablet by mouth at bedtime., Disp: , Rfl: 2 .  metFORMIN (GLUCOPHAGE-XR) 500 MG 24 hr tablet, , Disp: , Rfl:  .  methotrexate (RHEUMATREX) 2.5 MG tablet, TAKE 4 TABLETS BY MOUTH ONCE WEEKLY, Disp: , Rfl: 0 .  metoprolol succinate (TOPROL-XL) 50 MG 24 hr tablet, TAKE ONE TABLET BY MOUTH EVERY DAY, Disp: , Rfl:  .  montelukast (SINGULAIR) 10 MG tablet, , Disp: , Rfl:  .  Multiple Vitamin (MULTIVITAMIN) capsule, Take by mouth., Disp: , Rfl:  .  neomycin-polymyxin-hydrocortisone (CORTISPORIN) OTIC solution, Apply 2 drops to the ingrown toenail site twice daily. Cover with band-aid., Disp: 10 mL, Rfl: 0 .  NONFORMULARY OR COMPOUNDED  ITEM, Apply 1-2 g topically 4 (four) times daily. [facility]  Peripheral Neuropathy Cream- Bupivacaine 1%, Doxepin 3%, Gabapentin 6%, Pentoxifylline 3%, Topiramate 1% Apply 1-2 grams to affected area 3-4 times daily Qty. 120 gm 3 refills, Disp: 120 each, Rfl: 2 .  omeprazole (PRILOSEC) 20 MG capsule, Take 40 mg by mouth 2 (two) times daily., Disp: , Rfl: 5 .  potassium chloride (K-DUR) 10 MEQ tablet, , Disp: , Rfl:  .  sodium chloride (OCEAN NASAL SPRAY) 0.65 % nasal spray, Place 1 spray into the nose as needed for congestion., Disp: , Rfl:  .  triamcinolone cream (KENALOG) 0.1 %, Apply 1 application topically 2 (two) times daily., Disp: , Rfl:   Social History   Tobacco  Use  Smoking Status Former Smoker  Smokeless Tobacco Never Used    Allergies  Allergen Reactions  . Azithromycin Golden and Rash  . Ciprofloxacin Golden and Rash  . Hydrocodone-Acetaminophen Golden and Rash  . Levofloxacin Golden and Rash  . Hydrocodone-Acetaminophen    Objective:  There were no vitals filed for this visit. There is no height or weight on file to calculate BMI. Constitutional Well developed. Well nourished.  Vascular Dorsalis pedis pulses present 1+ bilaterally  Posterior tibial pulses present 1+ bilaterally  Pedal hair growth diminished. Capillary refill normal to all digits.  No cyanosis or clubbing noted.  Neurologic Normal speech. Oriented to person, place, and time. Epicritic sensation to light touch grossly present bilaterally. Protective sensation with 5.07 monofilament absent right diminished left. Vibratory sensation diminished bilaterally.  Dermatologic Nails elongated, thickened, dystrophic. No open wounds. No skin lesions.  Orthopedic: Normal joint ROM without pain or crepitus bilaterally. No visible deformities. No bony tenderness.   Assessment:   1. Ingrown nail   2. Pain around toenail   3. DM type 2 with diabetic peripheral neuropathy ([place])   4. Onychomycosis    Plan:  Patient was evaluated and treated and all questions answered.  Diabetes with PN, Onychomycosis -Educated on diabetic footcare. Diabetic risk level 1 -Nails x10 debrided sharply and manually with large nail nipper and rotary burr.   Procedure: Nail Debridement Rationale: Patient meets criteria for routine foot care due to DPN Type of Debridement: manual, sharp debridement. Instrumentation: Nail nipper, rotary burr. Number of Nails: 6   Ingrown Nail, bilaterally -Patient elects to proceed with ingrown toenail removal today -Ingrown nail excised. See procedure note. -Educated on post-procedure care including soaking. Written instructions provided. -Patient to follow  up in 2 weeks for nail check.  Procedure: Excision of Ingrown Toenail Location: Bilateral 1st toe; left bilat borders right lateral border Anesthesia: Lidocaine 1% plain; 1.46mL and Marcaine 0.5% plain; 1.32mL, digital block. Skin Prep: Betadine. Dressing: Silvadene; telfa; dry, sterile, compression dressing. Technique: Following skin prep, the toe was exsanguinated and a tourniquet was secured at the base of the toe. The affected nail border was freed, split with a nail splitter, and excised. Chemical matrixectomy was then performed with phenol and irrigated out with alcohol. The tourniquet was then removed and sterile dressing applied. Disposition: Patient tolerated procedure well. Patient to return in 2 weeks for follow-up.    Painful neuropathy -Rx compound pain cream.  Return in about 2 weeks (around 05/22/2018) for nail check.

## 2018-05-19 ENCOUNTER — Ambulatory Visit: Payer: Medicare Other

## 2018-05-19 DIAGNOSIS — L600 Ingrowing nail: Secondary | ICD-10-CM

## 2018-05-19 NOTE — Patient Instructions (Signed)

## 2018-05-20 NOTE — Progress Notes (Signed)
Patient is here today for follow-up appointment, recent procedure performed 05/08/2018, removal of bilateral ingrown toenails hallux nail both borders.  She states that overall everything is healing well and she does not have any pain at this time.  She does complain that her diabetic shoes are rubbing her foot and causing pain after her foot swells at the end of the day.  No erythema, no redness, no swelling, no drainage, no other signs and symptoms of infection.  Area is healing well and scabbing over at this time.  Discussed signs and symptoms of infection with patient.  Verbal and written instructions were given to the patient.  She is to follow-up as needed with any acute symptom changes.  I did advise her to make an appointment to see Liliane Channel about adjusting diabetic shoes.  And to not wear the shoes if they cause discomfort until she saw him.

## 2018-05-21 ENCOUNTER — Other Ambulatory Visit: Payer: Medicare Other

## 2018-05-27 ENCOUNTER — Other Ambulatory Visit: Payer: Medicare Other | Admitting: Orthotics

## 2018-06-04 ENCOUNTER — Ambulatory Visit: Payer: Medicare Other

## 2018-06-04 DIAGNOSIS — L600 Ingrowing nail: Secondary | ICD-10-CM

## 2018-06-04 NOTE — Patient Instructions (Signed)

## 2018-06-05 NOTE — Progress Notes (Signed)
Patient is here today for follow-up appointment, recent procedure performed 05/08/2018, removal of bilateral ingrown hallux nail borders.  She has stopped soaking and she states that overall the areas are healing well.  No erythema, no redness, no swelling, no drainage, no other signs symptoms of infection.  Area is healing well and scabbed over at this time.  Discussed signs and symptoms of infection with patient she is to follow-up as needed with any acute symptom changes.

## 2018-06-19 ENCOUNTER — Ambulatory Visit: Payer: Medicare Other

## 2018-06-19 DIAGNOSIS — L600 Ingrowing nail: Secondary | ICD-10-CM

## 2018-06-19 NOTE — Patient Instructions (Signed)

## 2018-07-08 NOTE — Progress Notes (Signed)
Patient is here today for follow-up appointment, recent procedure performed 05/08/2018, removal of bilateral ingrown hallux nail borders.  She has stopped soaking and she states that overall the areas are healing well.  No erythema, no redness, no swelling, no drainage, no other signs symptoms of infection.  Area is healing well and scabbed over at this time.  Discussed signs and symptoms of infection with patient she is to follow-up as needed with any acute symptom changes.

## 2018-07-09 ENCOUNTER — Ambulatory Visit: Payer: Medicare Other | Admitting: Orthotics

## 2018-07-09 DIAGNOSIS — M2041 Other hammer toe(s) (acquired), right foot: Secondary | ICD-10-CM

## 2018-07-09 DIAGNOSIS — M2011 Hallux valgus (acquired), right foot: Secondary | ICD-10-CM

## 2018-07-09 DIAGNOSIS — M2042 Other hammer toe(s) (acquired), left foot: Secondary | ICD-10-CM

## 2018-07-09 DIAGNOSIS — E1142 Type 2 diabetes mellitus with diabetic polyneuropathy: Secondary | ICD-10-CM

## 2018-07-09 DIAGNOSIS — M2012 Hallux valgus (acquired), left foot: Secondary | ICD-10-CM

## 2018-07-09 NOTE — Progress Notes (Signed)
Made adjustments to f/o by adding PPT padding to offer more c ushion at [facility].

## 2018-08-07 ENCOUNTER — Ambulatory Visit: Payer: Medicare Other | Admitting: Podiatry

## 2018-08-10 ENCOUNTER — Ambulatory Visit: Payer: Medicare Other | Admitting: Orthotics

## 2018-08-10 ENCOUNTER — Ambulatory Visit: Payer: Medicare Other | Admitting: Podiatry

## 2018-09-02 ENCOUNTER — Ambulatory Visit: Payer: Medicare Other | Admitting: Podiatry

## 2018-09-02 ENCOUNTER — Encounter: Payer: Self-pay | Admitting: Podiatry

## 2018-09-02 DIAGNOSIS — B351 Tinea unguium: Secondary | ICD-10-CM

## 2018-09-02 DIAGNOSIS — E1142 Type 2 diabetes mellitus with diabetic polyneuropathy: Secondary | ICD-10-CM | POA: Diagnosis not present

## 2018-09-02 NOTE — Patient Instructions (Signed)
Onychomycosis/Fungal Toenails  WHAT IS IT? An infection that lies within the keratin of your nail plate that is caused by a fungus.  WHY ME? Fungal infections affect all ages, sexes, races, and creeds.  There may be many factors that predispose you to a fungal infection such as age, coexisting medical conditions such as diabetes, or an autoimmune disease; stress, medications, fatigue, genetics, etc.  Bottom line: fungus thrives in a warm, moist environment and your shoes offer such a location.  IS IT CONTAGIOUS? Theoretically, yes.  You do not want to share shoes, nail clippers or files with someone who has fungal toenails.  Walking around barefoot in the same room or sleeping in the same bed is unlikely to transfer the organism.  It is important to realize, however, that fungus can spread easily from one nail to the next on the same foot.  HOW DO WE TREAT THIS?  There are several ways to treat this condition.  Treatment may depend on many factors such as age, medications, pregnancy, liver and kidney conditions, etc.  It is best to ask your doctor which options are available to you.  1. No treatment.   Unlike many other medical concerns, you can live with this condition.  However for many people this can be a painful condition and may lead to ingrown toenails or a bacterial infection.  It is recommended that you keep the nails cut short to help reduce the amount of fungal nail. 2. Topical treatment.  These range from herbal remedies to prescription strength nail lacquers.  About 40-50% effective, topicals require twice daily application for approximately 9 to 12 months or until an entirely new nail has grown out.  The most effective topicals are medical grade medications available through physicians offices. 3. Oral antifungal medications.  With an 80-90% cure rate, the most common oral medication requires 3 to 4 months of therapy and stays in your system for a year as the new nail grows out.  Oral  antifungal medications do require blood work to make sure it is a safe drug for you.  A liver function panel will be performed prior to starting the medication and after the first month of treatment.  It is important to have the blood work performed to avoid any harmful side effects.  In general, this medication safe but blood work is required. 4. Laser Therapy.  This treatment is performed by applying a specialized laser to the affected nail plate.  This therapy is noninvasive, fast, and non-painful.  It is not covered by insurance and is therefore, out of pocket.  The results have been very good with a 80-95% cure rate.  [facility] is the only practice in the area to offer this therapy. 5. Permanent Nail Avulsion.  Removing the entire nail so that a new nail will not grow back. Diabetic Neuropathy Diabetic neuropathy refers to nerve damage that is caused by diabetes (diabetes mellitus). Over time, people with diabetes can develop nerve damage throughout the body. There are several types of diabetic neuropathy:  Peripheral neuropathy. This is the most common type of diabetic neuropathy. It causes damage to nerves that carry signals between the spinal cord and other parts of the body (peripheral nerves). This usually affects nerves in the feet and legs first, and may eventually affect the hands and arms. The damage affects the ability to sense touch or temperature.  Autonomic neuropathy. This type causes damage to nerves that control involuntary functions (autonomic nerves). These  nerves carry signals that control: ? Heartbeat. ? Body temperature. ? Blood pressure. ? Urination. ? Digestion. ? Sweating. ? Sexual function. ? Response to changing blood sugar (glucose) levels.  Focal neuropathy. This type of nerve damage affects one area of the body, such as an arm, a leg, or the face. The injury may involve one nerve or a small group of nerves. Focal neuropathy can be painful and  unpredictable, and occurs most often in older adults with diabetes. This often develops suddenly, but usually improves over time and does not cause long-term problems.  Proximal neuropathy. This type of nerve damage affects the nerves of the thighs, hips, buttocks, or legs. It causes severe pain, weakness, and muscle death (atrophy), usually in the thigh muscles. It is more common among older men and people who have type 2 diabetes. The length of recovery time may vary. What are the causes? Peripheral, autonomic, and focal neuropathies are caused by diabetes that is not well controlled with treatment. The cause of proximal neuropathy is not known, but it may be caused by inflammation related to uncontrolled blood glucose levels. What are the signs or symptoms? Peripheral neuropathy Peripheral neuropathy develops slowly over time. When the nerves of the feet and legs no longer work, you may experience:  Burning, stabbing, or aching pain in the legs or feet.  Pain or cramping in the legs or feet.  Loss of feeling (numbness) and inability to feel pressure or pain in the feet. This can lead to: ? Thick calluses or sores on areas of constant pressure. ? Ulcers. ? Reduced ability to feel temperature changes.  Foot deformities.  Muscle weakness.  Loss of balance or coordination. Autonomic neuropathy The symptoms of autonomic neuropathy vary depending on which nerves are affected. Symptoms may include:  Problems with digestion, such as: ? Nausea or vomiting. ? Poor appetite. ? Bloating. ? Diarrhea or constipation. ? Trouble swallowing. ? Losing weight without trying to.  Problems with the heart, blood and lungs, such as: ? Dizziness, especially when standing up. ? Fainting. ? Shortness of breath. ? Irregular heartbeat.  Bladder problems, such as: ? Trouble starting or stopping urination. ? Leaking urine. ? Trouble emptying the bladder. ? Urinary tract infections  (UTIs).  Problems with other body functions, such as: ? Sweat. You may sweat too much or too little. ? Temperature. You might get hot easily. Or, you might feel cold more than usual. ? Sexual function. Men may not be able to get or maintain an erection. Women may have vaginal dryness and difficulty with arousal. Focal neuropathy Symptoms affect only one area of the body. Common symptoms include:  Numbness.  Tingling.  Burning pain.  Prickling feeling.  Very sensitive skin.  Weakness.  Inability to move (paralysis).  Muscle twitching.  Muscles getting smaller (wasting).  Poor coordination.  Double or blurred vision. Proximal neuropathy  Sudden, severe pain in the hip, thigh, or buttocks. Pain may spread from the back into the legs (sciatica).  Pain and numbness in the arms and legs.  Tingling.  Loss of bladder control or bowel control.  Weakness and wasting of thigh muscles.  Difficulty getting up from a seated position.  Abdominal swelling.  Unexplained weight loss. How is this diagnosed? Diagnosis usually involves reviewing your medical history and any symptoms you have. Diagnosis varies depending on the type of neuropathy your health care provider suspects. Peripheral neuropathy Your health care provider will check areas that are affected by your nervous system (neurologic exam),  such as your reflexes, how you move, and what you can feel. You may have other tests, such as:  Blood tests.  Removal and examination of fluid that surrounds the spinal cord (lumbar puncture).  CT scan.  MRI.  A test to check the nerves that control muscles (electromyogram, EMG).  Tests of how quickly messages pass through your nerves (nerve conduction velocity tests).  Removal of a small piece of nerve to be examined under a microscope (biopsy). Autonomic neuropathy You may have tests, such as:  Tests to measure your blood pressure and heart rate. This may include  monitoring you while you are safely secured to an exam table that moves you from a lying position to an upright position (table tilt test).  Breathing tests to check your lungs.  Tests to check how food moves through the digestive system (gastric emptying tests).  Blood, sweat, or urine tests.  Ultrasound of your bladder.  Spinal fluid tests. Focal neuropathy This condition may be diagnosed with:  A neurologic exam.  CT scan.  MRI.  EMG.  Nerve conduction velocity tests. Proximal neuropathy There is no test to diagnose this type of neuropathy. You may have tests to rule out other possible causes of this type of neuropathy. Tests may include:  X-rays of your spine and lumbar region.  Lumbar puncture.  MRI. How is this treated? The goal of treatment is to keep nerve damage from getting worse. The most important part of treatment is keeping your blood glucose level and your A1C level within your target range by following your diabetes management plan. Over time, maintaining lower blood glucose levels helps lessen symptoms. In some cases, you may need prescription pain medicine. Follow these instructions at home:  Lifestyle   Do not use any products that contain nicotine or tobacco, such as cigarettes and e-cigarettes. If you need help quitting, ask your health care provider.  Be physically active every day. Include strength training and balance exercises.  Follow a healthy meal plan.  Work with your health care provider to manage your blood pressure. General instructions  Follow your diabetes management plan as directed. ? Check your blood glucose levels as directed by your health care provider. ? Keep your blood glucose in your target range as directed by your health care provider. ? Have your A1C level checked at least two times a year, or as often as told by your health care provider.  Take over the counter and prescription medicines only as told by your health  care provider. This includes insulin and diabetes medicine.  Do not drive or use heavy machinery while taking prescription pain medicines.  Check your skin and feet every day for cuts, bruises, redness, blisters, or sores.  Keep all follow up visits as told by your health care provider. This is important. Contact a health care provider if:  You have burning, stabbing, or aching pain in your legs or feet.  You are unable to feel pressure or pain in your feet.  You develop problems with digestion, such as: ? Nausea. ? Vomiting. ? Bloating. ? Constipation. ? Diarrhea. ? Abdominal pain.  You have difficulty with urination, such as inability: ? To control when you urinate (incontinence). ? To completely empty the bladder (retention).  You have palpitations.  You feel dizzy, weak, or faint when you stand up. Get help right away if:  You cannot urinate.  You have sudden weakness or loss of coordination.  You have trouble speaking.  You have  pain or pressure in your chest.  You have an irregular heart beat.  You have sudden inability to move a part of your body. Summary  Diabetic neuropathy refers to nerve damage that is caused by diabetes. It can affect nerves throughout the entire body, causing numbness and pain in the arms, legs, digestive tract, heart, and other body systems.  Keep your blood glucose level and your blood pressure in your target range, as directed by your health care provider. This can help prevent neuropathy from getting worse.  Check your skin and feet every day for cuts, bruises, redness, blisters, or sores.  Do not use any products that contain nicotine or tobacco, such as cigarettes and e-cigarettes. If you need help quitting, ask your health care provider. This information is not intended to replace advice given to you by your health care provider. Make sure you discuss any questions you have with your health care provider. Document Released:  08/26/2001 Document Revised: 07/30/2017 Document Reviewed: 07/22/2016 [employer]vier Interactive Patient Education  2019 [employer].

## 2018-09-02 NOTE — Progress Notes (Signed)
Subjective: Megan Golden presents today with history of diabetic neuropathy with cc of painful, mycotic toenails.  Pain is aggravated when wearing enclosed shoe gear and relieved with periodic professional debridement.  Patient has peripheral neuropathy managed with gabapentin.  Reita Cliche, MD is her PCP.  Last visit May 21, 2018.   States she is continued to have increasing pain in both of her feet.  She relates burning from the bunion areas across the top of her forefoot area of both feet.  She relates she is unable to wear her shoes comfortably and this is all interfering with her daily activities.  She relates that neuropathy pain cream she received on last visit did not relieve her pain.  Patient also states that she gets swelling of her left foot during the day and it is difficult to wear her diabetic shoes.  She denies any calf pain.   Current Outpatient Medications:  .  Abaloparatide 3120 MCG/1.56ML SOPN, Inject into the skin., Disp: , Rfl:  .  albuterol (PROVENTIL HFA;VENTOLIN HFA) 108 (90 Base) MCG/ACT inhaler, Inhale into the lungs., Disp: , Rfl:  .  allopurinol (ZYLOPRIM) 100 MG tablet, , Disp: , Rfl:  .  Ascorbic Acid (VITAMIN C PO), Take by mouth., Disp: , Rfl:  .  aspirin EC 81 MG tablet, Take 81 mg by mouth daily., Disp: , Rfl:  .  atorvastatin (LIPITOR) 10 MG tablet, Take 5 mg by mouth daily., Disp: , Rfl: 11 .  Azelastine-Fluticasone 137-50 MCG/ACT SUSP, Place into the nose., Disp: , Rfl:  .  b complex vitamins capsule, Take 1 capsule by mouth daily., Disp: , Rfl:  .  BEE POLLEN PO, Take 580 mg by mouth daily., Disp: , Rfl:  .  Calcium-Magnesium-Vitamin D (CALCIUM MAGNESIUM PO), Take by mouth. Plus [phone] mg-unit, Disp: , Rfl:  .  Colchicine 0.6 MG CAPS, Take 1 capsule by mouth daily., Disp: , Rfl: 0 .  Cyanocobalamin (VITAMIN B-12 PO), Take by mouth., Disp: , Rfl:  .  fexofenadine (ALLEGRA) 180 MG tablet, Take by mouth., Disp: , Rfl:  .  Flaxseed,  Linseed, (FLAXSEED OIL) 1000 MG CAPS, Take by mouth., Disp: , Rfl:  .  fluticasone (FLONASE) 50 MCG/ACT nasal spray, INSTILL 1 SPRAY IN EACH NOSTRIL EVERY DAY AS NEEDED, Disp: , Rfl:  .  Fluticasone Furoate (ARNUITY ELLIPTA) 100 MCG/ACT AEPB, Inhale into the lungs., Disp: , Rfl:  .  folic acid (FOLVITE) 1 MG tablet, Take 1 mg by mouth daily., Disp: , Rfl: 0 .  furosemide (LASIX) 20 MG tablet, Take 20 mg by mouth 2 (two) times daily., Disp: , Rfl: 5 .  gabapentin (NEURONTIN) 400 MG capsule, , Disp: , Rfl:  .  glimepiride (AMARYL) 1 MG tablet, TAKE ONE TABLET BY MOUTH EVERY MORNING BEFORE BREAKFAST, Disp: , Rfl:  .  glucose blood (ONETOUCH VERIO) test strip, USE TO CHECK BLOOD SUGAR EVERY DAY, Disp: , Rfl:  .  hydroxychloroquine (PLAQUENIL) 200 MG tablet, , Disp: , Rfl:  .  losartan-hydrochlorothiazide (HYZAAR) 100-12.5 MG tablet, Take 1 tablet by mouth at bedtime., Disp: , Rfl: 2 .  metFORMIN (GLUCOPHAGE-XR) 500 MG 24 hr tablet, , Disp: , Rfl:  .  methotrexate (RHEUMATREX) 2.5 MG tablet, TAKE 4 TABLETS BY MOUTH ONCE WEEKLY, Disp: , Rfl: 0 .  metoprolol succinate (TOPROL-XL) 50 MG 24 hr tablet, TAKE ONE TABLET BY MOUTH EVERY DAY, Disp: , Rfl:  .  montelukast (SINGULAIR) 10 MG tablet, , Disp: , Rfl:  .  Multiple  Vitamin (MULTIVITAMIN) capsule, Take by mouth., Disp: , Rfl:  .  NONFORMULARY OR COMPOUNDED ITEM, Apply 1-2 g topically 4 (four) times daily. [facility]  Peripheral Neuropathy Cream- Bupivacaine 1%, Doxepin 3%, Gabapentin 6%, Pentoxifylline 3%, Topiramate 1% Apply 1-2 grams to affected area 3-4 times daily Qty. 120 gm 3 refills, Disp: 120 each, Rfl: 2 .  omeprazole (PRILOSEC) 20 MG capsule, Take 40 mg by mouth 2 (two) times daily., Disp: , Rfl: 5 .  potassium chloride (K-DUR) 10 MEQ tablet, , Disp: , Rfl:  .  potassium chloride (K-DUR,KLOR-CON) 10 MEQ tablet, Take by mouth., Disp: , Rfl:  .  sodium chloride (OCEAN NASAL SPRAY) 0.65 % nasal spray, Place 1 spray into the nose as needed  for congestion., Disp: , Rfl:  .  triamcinolone cream (KENALOG) 0.1 %, Apply 1 application topically 2 (two) times daily., Disp: , Rfl:   Allergies  Allergen Reactions  . Azithromycin Golden and Rash  . Ciprofloxacin Golden and Rash  . Hydrocodone-Acetaminophen Golden and Rash  . Levofloxacin Golden and Rash  . Hydrocodone-Acetaminophen     Objective:  Vascular Examination: Capillary refill time immediate x 10 digits.  Dorsalis pedis and Posterior tibial pulses palpable bilaterally.  Digital hair x 10 digits was diminished.  Skin temperature gradient WNL b/l.  There is no lower extremity edema noted today.  No pain with calf compression bilaterally.  Dermatological Examination: Skin with normal turgor, texture and tone b/l.  Toenails 1-5 b/l discolored, thick, dystrophic with subungual debris and pain with palpation to nailbeds due to thickness of nails.  No open wounds.  No interdigital macerations noted.  Musculoskeletal: Muscle strength 5/5 to all muscle groups b/l.  Hallux abductovalgus with bunion deformity right greater than left.  Mild digital contractures noted bilaterally digits 2 through 5.  Neurological: Sensation with 10 gram monofilament is intact bilaterally.    Assessment: 1. Painful onychomycosis toenails 1-5 b/l 2. NIDDM with neuropathy  Plan: 1. Toenails 1-5 b/l were debrided in length and girth without iatrogenic bleeding. 2. I educated her on not tying her shoelaces too tight.  At times, when shoelaces are tied too tight they can cause a neuritis on the dorsal aspect of the feet.  I loosened her shoestrings and her shoes on today patient related understanding. 3. We discussed her neuropathy symptoms on today.  I have advised her to call her PCP and appointment with that her current dose of gabapentin is not providing pain relief for her.  I have asked her to apply the neuropathy pain cream to her feet before bedtime. 4. For her pedal edema I have  prescribed compression knee-highs 20 to 30 mmHg.  She is to apply every morning and remove every evening.  The prescription was written for [facility]. 5. Patient to continue soft, supportive shoe gear daily. 6. Patient to report any pedal injuries to medical professional immediately. 7. Follow up 3 months.  8. Patient/POA to call should there be a concern in the interim.

## 2018-09-29 ENCOUNTER — Emergency Department (HOSPITAL_COMMUNITY): Payer: Medicare Other

## 2018-09-29 ENCOUNTER — Encounter (HOSPITAL_COMMUNITY): Payer: Self-pay | Admitting: Emergency Medicine

## 2018-09-29 ENCOUNTER — Inpatient Hospital Stay (HOSPITAL_COMMUNITY)
Admission: EM | Admit: 2018-09-29 | Discharge: 2018-10-02 | DRG: 177 | Disposition: A | Discharge status: Home / Self Care | Payer: Medicare Other | Attending: Internal Medicine | Admitting: Internal Medicine

## 2018-09-29 DIAGNOSIS — M05742 Rheumatoid arthritis with rheumatoid factor of left hand without organ or systems involvement: Secondary | ICD-10-CM

## 2018-09-29 DIAGNOSIS — R6889 Other general symptoms and signs: Secondary | ICD-10-CM

## 2018-09-29 DIAGNOSIS — Z20822 Contact with and (suspected) exposure to covid-19: Secondary | ICD-10-CM

## 2018-09-29 DIAGNOSIS — Z881 Allergy status to other antibiotic agents status: Secondary | ICD-10-CM

## 2018-09-29 DIAGNOSIS — I10 Essential (primary) hypertension: Secondary | ICD-10-CM | POA: Diagnosis not present

## 2018-09-29 DIAGNOSIS — Y92230 Patient room in hospital as the place of occurrence of the external cause: Secondary | ICD-10-CM | POA: Diagnosis not present

## 2018-09-29 DIAGNOSIS — K219 Gastro-esophageal reflux disease without esophagitis: Secondary | ICD-10-CM | POA: Diagnosis present

## 2018-09-29 DIAGNOSIS — Z853 Personal history of malignant neoplasm of breast: Secondary | ICD-10-CM | POA: Diagnosis not present

## 2018-09-29 DIAGNOSIS — Z825 Family history of asthma and other chronic lower respiratory diseases: Secondary | ICD-10-CM

## 2018-09-29 DIAGNOSIS — Z87891 Personal history of nicotine dependence: Secondary | ICD-10-CM

## 2018-09-29 DIAGNOSIS — M05741 Rheumatoid arthritis with rheumatoid factor of right hand without organ or systems involvement: Secondary | ICD-10-CM | POA: Diagnosis not present

## 2018-09-29 DIAGNOSIS — Z8262 Family history of osteoporosis: Secondary | ICD-10-CM

## 2018-09-29 DIAGNOSIS — E1142 Type 2 diabetes mellitus with diabetic polyneuropathy: Secondary | ICD-10-CM | POA: Diagnosis present

## 2018-09-29 DIAGNOSIS — Z8249 Family history of ischemic heart disease and other diseases of the circulatory system: Secondary | ICD-10-CM | POA: Diagnosis not present

## 2018-09-29 DIAGNOSIS — B9729 Other coronavirus as the cause of diseases classified elsewhere: Secondary | ICD-10-CM

## 2018-09-29 DIAGNOSIS — Z7951 Long term (current) use of inhaled steroids: Secondary | ICD-10-CM | POA: Diagnosis not present

## 2018-09-29 DIAGNOSIS — Z833 Family history of diabetes mellitus: Secondary | ICD-10-CM | POA: Diagnosis not present

## 2018-09-29 DIAGNOSIS — J1289 Other viral pneumonia: Secondary | ICD-10-CM | POA: Diagnosis present

## 2018-09-29 DIAGNOSIS — T378X5A Adverse effect of other specified systemic anti-infectives and antiparasitics, initial encounter: Secondary | ICD-10-CM | POA: Diagnosis not present

## 2018-09-29 DIAGNOSIS — M47812 Spondylosis without myelopathy or radiculopathy, cervical region: Secondary | ICD-10-CM | POA: Diagnosis present

## 2018-09-29 DIAGNOSIS — M810 Age-related osteoporosis without current pathological fracture: Secondary | ICD-10-CM | POA: Diagnosis present

## 2018-09-29 DIAGNOSIS — Z885 Allergy status to narcotic agent status: Secondary | ICD-10-CM | POA: Diagnosis not present

## 2018-09-29 DIAGNOSIS — R110 Nausea: Secondary | ICD-10-CM | POA: Diagnosis not present

## 2018-09-29 DIAGNOSIS — Z7982 Long term (current) use of aspirin: Secondary | ICD-10-CM

## 2018-09-29 DIAGNOSIS — M069 Rheumatoid arthritis, unspecified: Secondary | ICD-10-CM | POA: Diagnosis present

## 2018-09-29 DIAGNOSIS — J069 Acute upper respiratory infection, unspecified: Secondary | ICD-10-CM | POA: Diagnosis not present

## 2018-09-29 DIAGNOSIS — E119 Type 2 diabetes mellitus without complications: Secondary | ICD-10-CM

## 2018-09-29 DIAGNOSIS — Z79899 Other long term (current) drug therapy: Secondary | ICD-10-CM

## 2018-09-29 DIAGNOSIS — J45909 Unspecified asthma, uncomplicated: Secondary | ICD-10-CM | POA: Diagnosis present

## 2018-09-29 HISTORY — DX: Unspecified asthma, uncomplicated: J45.909

## 2018-09-29 HISTORY — DX: Rheumatoid arthritis, unspecified: M06.9

## 2018-09-29 HISTORY — DX: Gastro-esophageal reflux disease without esophagitis: K21.9

## 2018-09-29 HISTORY — DX: Solitary pulmonary nodule: R91.1

## 2018-09-29 HISTORY — DX: Type 2 diabetes mellitus without complications: E11.9

## 2018-09-29 HISTORY — DX: Essential (primary) hypertension: I10

## 2018-09-29 LAB — D-DIMER, QUANTITATIVE: D-Dimer, Quant: 1.51 ug/mL-FEU — ABNORMAL HIGH (ref 0.00–0.50)

## 2018-09-29 LAB — CBC WITH DIFFERENTIAL/PLATELET
Abs Immature Granulocytes: 0 10*3/uL (ref 0.00–0.07)
BAND NEUTROPHILS: 1 %
BASOS PCT: 0 %
Basophils Absolute: 0 10*3/uL (ref 0.0–0.1)
Eosinophils Absolute: 0 10*3/uL (ref 0.0–0.5)
Eosinophils Relative: 0 %
HEMATOCRIT: 34.5 % — AB (ref 36.0–46.0)
Hemoglobin: 11.6 g/dL — ABNORMAL LOW (ref 12.0–15.0)
LYMPHS PCT: 11 %
Lymphs Abs: 0.9 10*3/uL (ref 0.7–4.0)
MCH: 33.6 pg (ref 26.0–34.0)
MCHC: 33.6 g/dL (ref 30.0–36.0)
MCV: 100 fL (ref 80.0–100.0)
Monocytes Absolute: 0.1 10*3/uL (ref 0.1–1.0)
Monocytes Relative: 1 %
Neutro Abs: 7.1 10*3/uL (ref 1.7–7.7)
Neutrophils Relative %: 87 %
Platelets: 299 10*3/uL (ref 150–400)
RBC: 3.45 MIL/uL — ABNORMAL LOW (ref 3.87–5.11)
RDW: 13.2 % (ref 11.5–15.5)
WBC: 8.1 10*3/uL (ref 4.0–10.5)
nRBC: 0 % (ref 0.0–0.2)

## 2018-09-29 LAB — COMPREHENSIVE METABOLIC PANEL
ALT: 33 U/L (ref 0–44)
AST: 52 U/L — ABNORMAL HIGH (ref 15–41)
Albumin: 3.1 g/dL — ABNORMAL LOW (ref 3.5–5.0)
Alkaline Phosphatase: 51 U/L (ref 38–126)
Anion gap: 13 (ref 5–15)
BUN: 16 mg/dL (ref 8–23)
CHLORIDE: 98 mmol/L (ref 98–111)
CO2: 22 mmol/L (ref 22–32)
Calcium: 7.7 mg/dL — ABNORMAL LOW (ref 8.9–10.3)
Creatinine, Ser: 0.98 mg/dL (ref 0.44–1.00)
GFR calc Af Amer: >60 mL/min (ref >60)
GFR calc non Af Amer: 57 mL/min — ABNORMAL LOW (ref >60)
Glucose, Bld: 160 mg/dL — ABNORMAL HIGH (ref 70–99)
Potassium: 4 mmol/L (ref 3.5–5.1)
SODIUM: 133 mmol/L — AB (ref 135–145)
Total Bilirubin: 0.8 mg/dL (ref 0.3–1.2)
Total Protein: 7.5 g/dL (ref 6.5–8.1)

## 2018-09-29 LAB — LACTIC ACID, PLASMA: Lactic Acid, Venous: 1.7 mmol/L (ref 0.5–1.9)

## 2018-09-29 MED ORDER — HYDROXYCHLOROQUINE SULFATE 200 MG PO TABS
200.0000 mg | ORAL_TABLET | Freq: Two times a day (BID) | ORAL | Status: DC
  Administered 2018-09-30 – 2018-10-01 (×3): 200 mg via ORAL
  Filled 2018-09-29 (×5): qty 1

## 2018-09-29 MED ORDER — ENOXAPARIN SODIUM 40 MG/0.4ML ~~LOC~~ SOLN
40.0000 mg | Freq: Every day | SUBCUTANEOUS | Status: DC
  Administered 2018-09-30 – 2018-10-02 (×3): 40 mg via SUBCUTANEOUS
  Filled 2018-09-29 (×4): qty 0.4

## 2018-09-29 MED ORDER — ACETAMINOPHEN 325 MG PO TABS
650.0000 mg | ORAL_TABLET | Freq: Four times a day (QID) | ORAL | Status: DC | PRN
  Administered 2018-09-30 – 2018-10-02 (×3): 650 mg via ORAL
  Filled 2018-09-29 (×3): qty 2

## 2018-09-29 MED ORDER — SODIUM CHLORIDE 0.9 % IV BOLUS (SEPSIS)
1000.0000 mL | Freq: Once | INTRAVENOUS | Status: AC
  Administered 2018-09-29: 1000 mL via INTRAVENOUS

## 2018-09-29 MED ORDER — ACETAMINOPHEN 325 MG PO TABS
650.0000 mg | ORAL_TABLET | Freq: Once | ORAL | Status: AC
  Administered 2018-09-29: 650 mg via ORAL
  Filled 2018-09-29: qty 2

## 2018-09-29 MED ORDER — CALCIUM GLUCONATE-NACL 1-0.675 GM/50ML-% IV SOLN
1.0000 g | Freq: Once | INTRAVENOUS | Status: AC
  Administered 2018-09-30: 1000 mg via INTRAVENOUS
  Filled 2018-09-29: qty 50

## 2018-09-29 MED ORDER — INSULIN ASPART 100 UNIT/ML ~~LOC~~ SOLN
0.0000 [IU] | Freq: Three times a day (TID) | SUBCUTANEOUS | Status: DC
  Administered 2018-09-30 – 2018-10-02 (×3): 2 [IU] via SUBCUTANEOUS

## 2018-09-29 MED ORDER — HYDROXYCHLOROQUINE SULFATE 200 MG PO TABS
400.0000 mg | ORAL_TABLET | Freq: Two times a day (BID) | ORAL | Status: AC
  Administered 2018-09-30 (×2): 400 mg via ORAL
  Filled 2018-09-29 (×2): qty 2

## 2018-09-29 MED ORDER — ONDANSETRON HCL 4 MG/2ML IJ SOLN
4.0000 mg | Freq: Once | INTRAMUSCULAR | Status: AC
  Administered 2018-09-29: 4 mg via INTRAVENOUS
  Filled 2018-09-29: qty 2

## 2018-09-29 NOTE — ED Notes (Signed)
Bed: DJ24 Expected date:  Expected time:  Means of arrival:  Comments: 73 yr old female, COVID exposure

## 2018-09-29 NOTE — H&P (Signed)
History and Physical    Megan Golden NAT:[id number] DOB: [birth year] DOA: 09/29/2018  PCP: Reita Cliche, MD  Patient coming from: Home  I have personally briefly reviewed patient's old medical records in [place]  Chief Complaint: Cough, SOB  HPI: Megan Golden is a 73 y.o. female with medical history significant of DM2, HTN, RA on MTX.  Patient has been feeling ill with cough, SOB, diarrhea, body aches, fever.  Symptoms ongoing since around the 19th.  Persistent / worsening over the past week.  Saw PCP on Oct 21, 2022 who was concerned for COVID 19.  Influenza test done there was neg.  CXR showed multifocal PNA.  Patient put on Augmentin.  Results of patients COVID-19 test not yet known.  Patients brother-in-law (whom she lives with) also became sick.  Was admitted to [facility], and passed away on ventilator.  His COVID-19 testing has since come back positive!  Despite treatment she says her SOB symptoms have continued to progressively worsen and she presents to the ED today.   ED Course: Tm 102.x.  Not needing O2 at this time.  CXR shows progressive B multifocal PNA.   Review of Systems: As per HPI otherwise 10 point review of systems negative.   Past Medical History:  Diagnosis Date  . Asthma   . DM2 (diabetes mellitus, type 2) ([place])   . GERD (gastroesophageal reflux disease)   . HTN (hypertension)   . Pulmonary nodule   . RA (rheumatoid arthritis) ([facility])    RF positive    Past Surgical History:  Procedure Laterality Date  . BREAST LUMPECTOMY Left 2007     reports that she has quit smoking. She has never used smokeless tobacco. She reports previous alcohol use. She reports that she does not use drugs.  Allergies  Allergen Reactions  . Azithromycin Hives and Rash  . Ciprofloxacin Hives and Rash  . Hydrocodone-Acetaminophen Hives and Rash  . Levofloxacin Hives and Rash    Family History  Problem Relation Age of Onset  . Pneumonia Other        Died of Coronavirus  Oct 21, 2018     Prior to Admission medications   Medication Sig Start Date End Date Taking? Authorizing Provider  albuterol (PROVENTIL HFA;VENTOLIN HFA) 108 (90 Base) MCG/ACT inhaler Inhale 1-2 puffs into the lungs every 4 (four) hours as needed for wheezing or shortness of breath.  07/16/17  Yes [provider]  allopurinol (ZYLOPRIM) 100 MG tablet Take 100 mg by mouth daily.  03/17/18  Yes [provider]  Ascorbic Acid (VITAMIN C PO) Take 1 tablet by mouth daily.    Yes [provider]  aspirin EC 81 MG tablet Take 81 mg by mouth daily.   Yes [provider]  atorvastatin (LIPITOR) 10 MG tablet Take 5 mg by mouth daily. 05/29/18  Yes [provider]  b complex vitamins capsule Take 1 capsule by mouth daily.   Yes [provider]  BEE POLLEN PO Take 580 mg by mouth daily.   Yes [provider]  Calcium-Magnesium-Vitamin D (CALCIUM MAGNESIUM PO) Take 1 tablet by mouth daily. Plus [phone] mg-unit    Yes [provider]  Colchicine 0.6 MG CAPS Take 1 capsule by mouth daily. 02/16/18  Yes [provider]  Cyanocobalamin (VITAMIN B-12 PO) Take by mouth.   Yes [provider]  fexofenadine (ALLEGRA) 180 MG tablet Take 180 mg by mouth daily.    Yes [provider]  Flaxseed, Linseed, (FLAXSEED OIL)  1000 MG CAPS Take by mouth daily.    Yes [provider]  fluticasone (FLONASE) 50 MCG/ACT nasal spray Place 1 spray into both nostrils daily as needed for allergies.  07/09/17  Yes [provider]  Fluticasone Furoate (ARNUITY ELLIPTA) 100 MCG/ACT AEPB Inhale 1 puff into the lungs daily.  12/19/17  Yes [provider]  folic acid (FOLVITE) 1 MG tablet Take 1 mg by mouth daily. 03/10/18  Yes [provider]  furosemide (LASIX) 20 MG tablet Take 20 mg by mouth 2 (two) times daily. 03/10/18  Yes [provider]  gabapentin (NEURONTIN) 400 MG capsule Take 400 mg by mouth  2 (two) times daily.  03/18/18  Yes [provider]  glimepiride (AMARYL) 1 MG tablet TAKE ONE TABLET BY MOUTH EVERY MORNING BEFORE BREAKFAST 03/18/18  Yes [provider]  losartan-hydrochlorothiazide (HYZAAR) 100-12.5 MG tablet Take 1 tablet by mouth at bedtime. 03/11/18  Yes [provider]  metFORMIN (GLUCOPHAGE) 500 MG tablet Take 500 mg by mouth 2 (two) times daily with a meal.   Yes [provider]  methotrexate (RHEUMATREX) 2.5 MG tablet TAKE 4 TABLETS BY MOUTH ONCE WEEKLY 03/04/18  Yes [provider]  Abaloparatide 3120 MCG/1.56ML SOPN Inject into the skin. 03/06/17   [provider]  Azelastine-Fluticasone 137-50 MCG/ACT SUSP Place into the nose.    [provider]  glucose blood (ONETOUCH VERIO) test strip USE TO CHECK BLOOD SUGAR EVERY DAY 09/01/17   [provider]  hydroxychloroquine (PLAQUENIL) 200 MG tablet  01/22/18   [provider]  metoprolol succinate (TOPROL-XL) 50 MG 24 hr tablet TAKE ONE TABLET BY MOUTH EVERY DAY 03/08/15   [provider]  montelukast (SINGULAIR) 10 MG tablet  03/17/18   [provider]  Multiple Vitamin (MULTIVITAMIN) capsule Take by mouth.    [provider]  NONFORMULARY OR COMPOUNDED ITEM Apply 1-2 g topically 4 (four) times daily. [facility]  Peripheral Neuropathy Cream- Bupivacaine 1%, Doxepin 3%, Gabapentin 6%, Pentoxifylline 3%, Topiramate 1% Apply 1-2 grams to affected area 3-4 times daily Qty. 120 gm 3 refills 03/19/18   Evelina Bucy, DPM  omeprazole (PRILOSEC) 20 MG capsule Take 40 mg by mouth 2 (two) times daily. 03/10/18   [provider]  potassium chloride (K-DUR) 10 MEQ tablet  03/17/18   [provider]  potassium chloride (K-DUR,KLOR-CON) 10 MEQ tablet Take by mouth. 01/08/18   [provider]  sodium chloride (OCEAN NASAL SPRAY) 0.65 % nasal spray Place 1 spray into the nose as needed for congestion.     [provider]  triamcinolone cream (KENALOG) 0.1 % Apply 1 application topically 2 (two) times daily.    [provider]    Physical Exam: Vitals:   09/29/18 2200 09/29/18 2230 09/29/18 2236 09/29/18 2300  BP: 131/66 120/74 127/68 125/67  Pulse: 73 71 65 74  Resp:   20   Temp:   (!) 102.1 F (38.9 C)   TempSrc:   Rectal   SpO2: 95% 97% 96% 96%    Constitutional: NAD, calm, comfortable Eyes: PERRL, lids and conjunctivae normal ENMT: Mucous membranes are moist. Posterior pharynx clear of any exudate or lesions.Normal dentition.  Neck: normal, supple, no masses, no thyromegaly Respiratory: Diffuse wheezing and rhonchi.  Cardiovascular: Regular rate and rhythm, no murmurs / rubs / gallops. No extremity edema. 2+ pedal pulses. No carotid bruits.  Abdomen: no tenderness, no masses palpated. No hepatosplenomegaly. Bowel sounds positive.  Musculoskeletal: no clubbing /  cyanosis. No joint deformity upper and lower extremities. Good ROM, no contractures. Normal muscle tone.  Skin: no rashes, lesions, ulcers. No induration Neurologic: CN 2-12 grossly intact. Sensation intact, DTR normal. Strength 5/5 in all 4.  Psychiatric: Normal judgment and insight. Alert and oriented x 3. Normal mood.    Labs on Admission: I have personally reviewed following labs and imaging studies  CBC: Recent Labs  Lab 09/29/18 2215  WBC 8.1  NEUTROABS 7.1  HGB 11.6*  HCT 34.5*  MCV 100.0  PLT 614   Basic Metabolic Panel: Recent Labs  Lab 09/29/18 2215  NA 133*  K 4.0  CL 98  CO2 22  GLUCOSE 160*  BUN 16  CREATININE 0.98  CALCIUM 7.7*   GFR: CrCl cannot be calculated (Unknown ideal weight.). Liver Function Tests: Recent Labs  Lab 09/29/18 2215  AST 52*  ALT 33  ALKPHOS 51  BILITOT 0.8  PROT 7.5  ALBUMIN 3.1*   No results for input(s): LIPASE, AMYLASE in the last 168 hours. No results for input(s): AMMONIA in the last 168 hours. Coagulation Profile: No results for  input(s): INR, PROTIME in the last 168 hours. Cardiac Enzymes: No results for input(s): CKTOTAL, CKMB, CKMBINDEX, TROPONINI in the last 168 hours. BNP (last 3 results) No results for input(s): PROBNP in the last 8760 hours. HbA1C: No results for input(s): HGBA1C in the last 72 hours. CBG: No results for input(s): GLUCAP in the last 168 hours. Lipid Profile: No results for input(s): CHOL, HDL, LDLCALC, TRIG, CHOLHDL, LDLDIRECT in the last 72 hours. Thyroid Function Tests: No results for input(s): TSH, T4TOTAL, FREET4, T3FREE, THYROIDAB in the last 72 hours. Anemia Panel: No results for input(s): VITAMINB12, FOLATE, FERRITIN, TIBC, IRON, RETICCTPCT in the last 72 hours. Urine analysis: No results found for: COLORURINE, APPEARANCEUR, LABSPEC, PHURINE, GLUCOSEU, HGBUR, BILIRUBINUR, KETONESUR, PROTEINUR, UROBILINOGEN, NITRITE, LEUKOCYTESUR  Radiological Exams on Admission: Dg Chest Portable 1 View  Result Date: 09/29/2018 CLINICAL DATA:  Cough, fever.  Possible Covid exposure EXAM: PORTABLE CHEST 1 VIEW COMPARISON:  09/25/2018 FINDINGS: Normal heart size. Aortic atherosclerosis. Bilateral patchy airspace densities are again identified within the right upper lobe and left midlung. These appear increased when compared with previous exam. IMPRESSION: Progressive bilateral airspace opacities compared with 09/25/2018 compatible with multifocal pneumonia. Electronically Signed   By: Kerby Moors M.D.   On: 09/29/2018 22:34    EKG: Independently reviewed.  Assessment/Plan Principal Problem:   Acute respiratory disease due to COVID-19 virus Active Problems:   Essential hypertension   Type 2 diabetes mellitus without retinopathy ([place])   Rheumatoid arthritis ([place])    1. Acute respiratory disease due to likely COVID-19 virus - 1. Resp disease worsening despite augmentin and doxycycline. 2. Sending for COVID testing, but this case is fairly clear cut COVID-19 (even if test would be negative, My  initial assumption would be that this represented a false negative). 3. Influenza neg 4. RVP from pulm office on Fri pending, but again, COVID-19 is highly likely given the history. 5. Spoke with Dr. Linus Salmons: 1. Start hydroxychloroquine - d/w patient the somewhat anecdotal evidence of its effect at this point, although FDA did just formally approve its use in COVID on Sunday... 6. Pt allergic to azithromycin so cant order that 2. RA - 1. Patient currently off of MTX, held last week due to acute illness and concern for COVID 3. HTN - 1. Continue home BP meds 2. Holding home lasix 4. DM2 - 1. Hold home hypoglycemics 2. Mod  scale SSI AC  DVT prophylaxis: Lovenox Code Status: Full Family Communication: No family in room - sister apparently still asymptomatic from a COVID standpoint though she is on quarantine. Disposition Plan: Home after admit Consults called: none Admission status: Admit to inpatient  Severity of Illness: The appropriate patient status for this patient is INPATIENT. Inpatient status is judged to be reasonable and necessary in order to provide the required intensity of service to ensure the patient's safety. The patient's presenting symptoms, physical exam findings, and initial radiographic and laboratory data in the context of their chronic comorbidities is felt to place them at high risk for further clinical deterioration. Furthermore, it is not anticipated that the patient will be medically stable for discharge from the hospital within 2 midnights of admission. The following factors support the patient status of inpatient.   " The patient's presenting symptoms include worsening SOB, cough, fever. " The worrisome physical exam findings include Cough, fever. " The initial radiographic and laboratory data are worrisome because of multifocal PNA. " The chronic co-morbidities include RA on MTX, asthma, HTN, DM2.  Patient with worsening COVID-19 / PNA symptoms, has failed  outpatient treatment at this point.  * I certify that at the point of admission it is my clinical judgment that the patient will require inpatient hospital care spanning beyond 2 midnights from the point of admission due to high intensity of service, high risk for further deterioration and high frequency of surveillance required.*    ,  M. DO [facility]  How to contact the [facility] Attending or Consulting provider [place] or covering provider during after hours [place], for this patient?  1. Check the care team in [facility] and look for a) attending/consulting [facility] provider listed and b) the [facility] team listed 2. Log into [email or web address]  Amion Physician Scheduling and messaging for groups and whole hospitals  On call and physician scheduling software for group practices, residents, hospitalists and other medical providers for call, clinic, rotation and shift schedules. On[employer] is a hospital-wide system for scheduling doctors and paging doctors on call. EasyPlot is for scientific plotting and data analysis.  [email or web address]  and use [place]'s universal password to access. If you do not have the password, please contact the hospital operator.  3. Locate the [facility] provider you are looking for under [facility] and page to a number that you can be directly reached. 4. If you still have difficulty reaching the provider, please page the [facility] (Director on Call) for the Hospitalists listed on amion for assistance.  09/29/2018, 11:55 PM

## 2018-09-29 NOTE — ED Notes (Signed)
Walked pt no problem

## 2018-09-29 NOTE — ED Triage Notes (Signed)
Pt comes to ed via ems, c/o cough, sob and confirmed family member exposure. Family states Pts brother in law was a confirmed COVID19 exposure death. This in not supported and is story given by ems, and needs more information  V/s 124/80, pluse 73, 94 on room air, rr20., 159 cbg.

## 2018-09-29 NOTE — ED Provider Notes (Signed)
[facility] DEPT Provider Note   CSN: [id number] Arrival date & time: 09/29/18  2111    History   Chief Complaint Chief Complaint  Patient presents with   Emesis   Cough   Shortness of Breath    HPI Megan Golden is a 73 y.o. female.     Patient is a 73 year old female with past medical history of asthma, diabetes, rheumatoid arthritis, breast cancer who presents emergency department for cough, fever, shortness of breath, nausea vomiting.  This is been going on for several days.  Reports that she was evaluated by primary care doctor last Friday.  She had a negative influenza test.  It appears that she had bilateral multifocal pneumonia on xray that day as well, given augmentin. Reports she found out today that her brother in law, whom she lives with and whom past away a few days ago, was positive for covid19.  Patient reports being tested for covid and being told it was positive.      Past Medical History:  Diagnosis Date   Asthma    DM2 (diabetes mellitus, type 2) ([place])    GERD (gastroesophageal reflux disease)    HTN (hypertension)    Pulmonary nodule    RA (rheumatoid arthritis) ([place])    RF positive    Patient Active Problem List   Diagnosis Date Noted   Acute respiratory disease due to COVID-19 virus 09/29/2018   Rheumatoid arthritis ([place]) 09/29/2018   Diabetic peripheral neuropathy ([place]) 02/10/2018   Posterior vitreous detachment, bilateral 12/29/2017   Vitreous floaters, bilateral 12/29/2017   Hyperuricemia 11/11/2017   Osteoarthritis of cervical spine 10/23/2017   Localized edema 07/04/2017   Dysfunction of both eustachian tubes 05/12/2017   Oral thrush 05/12/2017   Arthritis of hand, right 02/19/2017   Pulmonary nodule 12/24/2016   Type 2 diabetes mellitus without retinopathy ([place]) 12/18/2016   Age-related osteoporosis without current pathological fracture 11/15/2016   Chronic bilateral back pain  11/07/2016   History of breast cancer 08/09/2016   Moderate persistent asthma with acute exacerbation 08/09/2016   Aftercare following surgery of the musculoskeletal system 07/14/2016   Bunion 02/19/2016   Allergic rhinitis 02/08/2016   Diabetes mellitus ([place]) 08/09/2015   Arthritis of elbow, left 03/23/2015   Laryngopharyngeal reflux 06/06/2014   Other tenosynovitis of hand and wrist 02/28/2014   Primary localized osteoarthrosis of forearm 02/28/2014   Shortness of breath 10/07/2012   Keratoconjunctivitis sicca not due to Sjogren's syndrome 09/17/2011   Malignant neoplasm of female breast ([place]) 09/17/2011   Sjogren's syndrome ([place]) 09/17/2011   OBSTRUCTIVE SLEEP APNEA 06/03/2007   Essential hypertension 06/03/2007   GERD 06/03/2007   COUGH 06/03/2007    Past Surgical History:  Procedure Laterality Date   BREAST LUMPECTOMY Left 2007   ROTATOR CUFF REPAIR       OB History   No obstetric history on file.      Home Medications    Prior to Admission medications   Medication Sig Start Date End Date Taking? Authorizing Provider  albuterol (PROVENTIL HFA;VENTOLIN HFA) 108 (90 Base) MCG/ACT inhaler Inhale 1-2 puffs into the lungs every 4 (four) hours as needed for wheezing or shortness of breath.  07/16/17  Yes [provider]  allopurinol (ZYLOPRIM) 100 MG tablet Take 100 mg by mouth daily.  03/17/18  Yes [provider]  Ascorbic Acid (VITAMIN C PO) Take 1 tablet by mouth daily.    Yes [provider]  aspirin EC 81 MG tablet Take  81 mg by mouth daily.   Yes [provider]  atorvastatin (LIPITOR) 10 MG tablet Take 5 mg by mouth daily. 05/29/18  Yes [provider]  b complex vitamins capsule Take 1 capsule by mouth daily.   Yes [provider]  BEE POLLEN PO Take 580 mg by mouth daily.   Yes [provider]  Calcium-Magnesium-Vitamin D (CALCIUM MAGNESIUM PO) Take 1 tablet by mouth daily. Plus  [phone] mg-unit    Yes [provider]  Colchicine 0.6 MG CAPS Take 1 capsule by mouth daily. 02/16/18  Yes [provider]  Cyanocobalamin (VITAMIN B-12 PO) Take 1 tablet by mouth daily.    Yes [provider]  doxycycline (VIBRAMYCIN) 100 MG capsule Take 100 mg by mouth 2 (two) times daily. 09/25/18 10/05/18 Yes [provider]  DULoxetine (CYMBALTA) 30 MG capsule Take 30 mg by mouth daily.   Yes [provider]  fexofenadine (ALLEGRA) 180 MG tablet Take 180 mg by mouth daily.    Yes [provider]  Flaxseed, Linseed, (FLAXSEED OIL) 1000 MG CAPS Take 1 capsule by mouth daily.    Yes [provider]  fluticasone (FLONASE) 50 MCG/ACT nasal spray Place 1 spray into both nostrils daily as needed for allergies.  07/09/17  Yes [provider]  Fluticasone Furoate (ARNUITY ELLIPTA) 100 MCG/ACT AEPB Inhale 1 puff into the lungs daily.  12/19/17  Yes [provider]  folic acid (FOLVITE) 1 MG tablet Take 1 mg by mouth daily. 03/10/18  Yes [provider]  furosemide (LASIX) 20 MG tablet Take 20 mg by mouth 2 (two) times daily. 03/10/18  Yes [provider]  gabapentin (NEURONTIN) 400 MG capsule Take 400 mg by mouth 2 (two) times daily.  03/18/18  Yes [provider]  glimepiride (AMARYL) 1 MG tablet Take 1 mg by mouth daily with breakfast.  03/18/18  Yes [provider]  losartan-hydrochlorothiazide (HYZAAR) 100-12.5 MG tablet Take 1 tablet by mouth at bedtime. 03/11/18  Yes [provider]  metFORMIN (GLUCOPHAGE-XR) 500 MG 24 hr tablet Take 500 mg by mouth 2 (two) times daily.   Yes [provider]  methotrexate (RHEUMATREX) 2.5 MG tablet Take 15 mg by mouth once a week.  03/04/18  Yes [provider]  metoprolol succinate (TOPROL-XL) 50 MG 24 hr tablet Take 50 mg by mouth daily.  03/08/15  Yes [provider]  montelukast (SINGULAIR) 10 MG tablet Take 10 mg by  mouth at bedtime.  03/17/18  Yes [provider]  Multiple Vitamin (MULTIVITAMIN) capsule Take 1 capsule by mouth daily.    Yes [provider]  NONFORMULARY OR COMPOUNDED ITEM Apply 1-2 g topically 4 (four) times daily. [facility]  Peripheral Neuropathy Cream- Bupivacaine 1%, Doxepin 3%, Gabapentin 6%, Pentoxifylline 3%, Topiramate 1% Apply 1-2 grams to affected area 3-4 times daily Qty. 120 gm 3 refills 03/19/18  Yes Evelina Bucy, DPM  omega-3 acid ethyl esters (LOVAZA) 1 g capsule Take 1 g by mouth daily.   Yes [provider]  omeprazole (PRILOSEC) 20 MG capsule Take 40 mg by mouth 2 (two) times daily. 03/10/18  Yes [provider]  potassium chloride (K-DUR,KLOR-CON) 10 MEQ tablet Take 10 mEq by mouth 2 (two) times daily.  01/08/18  Yes [provider]  promethazine-dextromethorphan (PROMETHAZINE-DM) 6.25-15 MG/5ML syrup Take 5 mLs by mouth every 6 (six) hours as needed. 06/16/18  Yes [provider]  sodium chloride (OCEAN NASAL SPRAY) 0.65 % nasal  spray Place 1 spray into the nose as needed for congestion.   Yes [provider]  vitamin E 100 UNIT capsule Take 100 Units by mouth daily.   Yes [provider]  glucose blood (ONETOUCH VERIO) test strip USE TO CHECK BLOOD SUGAR EVERY DAY 09/01/17   [provider]    Family History Family History  Problem Relation Age of Onset   Pneumonia Other        Died of Coronavirus 16-Oct-2018   Hypertension Mother    Diabetes Mother    Stroke Mother    Emphysema Mother    Hypertension Father    Emphysema Father    Heart disease Father    Diabetes Sister    Osteoporosis Sister    Emphysema Sister     Social History Social History   Tobacco Use   Smoking status: Former Smoker   Smokeless tobacco: Never Used  Substance Use Topics   Alcohol use: Not Currently   Drug use: Never     Allergies   Azithromycin; Ciprofloxacin;  Hydrocodone-acetaminophen; and Levofloxacin   Review of Systems Review of Systems  Constitutional: Positive for appetite change and fever. Negative for chills.  HENT: Negative for ear pain and sore throat.   Eyes: Negative for pain and visual disturbance.  Respiratory: Positive for cough, shortness of breath and wheezing.   Cardiovascular: Negative for chest pain and palpitations.  Gastrointestinal: Positive for nausea and vomiting. Negative for abdominal pain.  Genitourinary: Negative for dysuria and hematuria.  Musculoskeletal: Negative for arthralgias and back pain.  Skin: Negative for color change and rash.  Neurological: Positive for weakness. Negative for seizures and syncope.  All other systems reviewed and are negative.    Physical Exam Updated Vital Signs BP (!) 143/65 (BP Location: Left Arm)    Pulse 63    Temp 97.8 F (36.6 C) (Oral)    Resp 15    Ht 5' (1.524 m)    Wt 76.7 kg    SpO2 96%    BMI 33.01 kg/m   Physical Exam Vitals signs and nursing note reviewed.  Constitutional:      General: She is not in acute distress.    Appearance: She is not ill-appearing, toxic-appearing or diaphoretic.  HENT:     Head: Normocephalic and atraumatic.     Mouth/Throat:     Mouth: Mucous membranes are moist.  Eyes:     Pupils: Pupils are equal, round, and reactive to light.  Cardiovascular:     Rate and Rhythm: Normal rate and regular rhythm.  Pulmonary:     Effort: Tachypnea present.     Breath sounds: Wheezing and rhonchi present. No decreased breath sounds.  Chest:     Chest wall: No tenderness.  Neurological:     Mental Status: She is alert.  Psychiatric:        Mood and Affect: Mood is anxious.      ED Treatments / Results  Labs (all labs ordered are listed, but only abnormal results are displayed) Labs Reviewed  COMPREHENSIVE METABOLIC PANEL - Abnormal; Notable for the following components:      Result Value   Sodium 133 (*)    Glucose, Bld 160 (*)     Calcium 7.7 (*)    Albumin 3.1 (*)    AST 52 (*)    GFR calc non Af Amer 57 (*)    All other components within normal limits  CBC WITH DIFFERENTIAL/PLATELET - Abnormal; Notable for the following  components:   RBC 3.45 (*)    Hemoglobin 11.6 (*)    HCT 34.5 (*)    All other components within normal limits  D-DIMER, QUANTITATIVE (NOT AT [facility]) - Abnormal; Notable for the following components:   D-Dimer, Quant 1.51 (*)    All other components within normal limits  CBC WITH DIFFERENTIAL/PLATELET - Abnormal; Notable for the following components:   RBC 3.05 (*)    Hemoglobin 10.4 (*)    HCT 30.2 (*)    MCH 34.1 (*)    All other components within normal limits  COMPREHENSIVE METABOLIC PANEL - Abnormal; Notable for the following components:   Sodium 133 (*)    Glucose, Bld 129 (*)    Calcium 8.4 (*)    Total Protein 6.3 (*)    Albumin 2.7 (*)    GFR calc non Af Amer 60 (*)    All other components within normal limits  C-REACTIVE PROTEIN - Abnormal; Notable for the following components:   CRP 21.4 (*)    All other components within normal limits  URINALYSIS, ROUTINE W REFLEX MICROSCOPIC - Abnormal; Notable for the following components:   Color, Urine AMBER (*)    Ketones, ur 5 (*)    Protein, ur >=300 (*)    All other components within normal limits  GLUCOSE, CAPILLARY - Abnormal; Notable for the following components:   Glucose-Capillary 138 (*)    All other components within normal limits  GLUCOSE, CAPILLARY - Abnormal; Notable for the following components:   Glucose-Capillary 131 (*)    All other components within normal limits  CBC WITH DIFFERENTIAL/PLATELET - Abnormal; Notable for the following components:   RBC 3.16 (*)    Hemoglobin 10.5 (*)    HCT 32.0 (*)    MCV 101.3 (*)    All other components within normal limits  COMPREHENSIVE METABOLIC PANEL - Abnormal; Notable for the following components:   Sodium 133 (*)    Potassium 3.0 (*)    Glucose, Bld 112 (*)    Calcium  8.1 (*)    Total Protein 6.2 (*)    Albumin 2.4 (*)    AST 52 (*)    All other components within normal limits  GLUCOSE, CAPILLARY - Abnormal; Notable for the following components:   Glucose-Capillary 118 (*)    All other components within normal limits  GLUCOSE, CAPILLARY - Abnormal; Notable for the following components:   Glucose-Capillary 101 (*)    All other components within normal limits  GLUCOSE, CAPILLARY - Abnormal; Notable for the following components:   Glucose-Capillary 111 (*)    All other components within normal limits  GLUCOSE, CAPILLARY - Abnormal; Notable for the following components:   Glucose-Capillary 113 (*)    All other components within normal limits  CULTURE, BLOOD (ROUTINE X 2)  CULTURE, BLOOD (ROUTINE X 2)  URINE CULTURE  NOVEL CORONAVIRUS, NAA (HOSPITAL ORDER, SEND-OUT TO REF LAB)  LACTIC ACID, PLASMA  PROCALCITONIN    EKG None  Radiology Dg Chest Portable 1 View  Result Date: 09/29/2018 CLINICAL DATA:  Cough, fever.  Possible Covid exposure EXAM: PORTABLE CHEST 1 VIEW COMPARISON:  09/25/2018 FINDINGS: Normal heart size. Aortic atherosclerosis. Bilateral patchy airspace densities are again identified within the right upper lobe and left midlung. These appear increased when compared with previous exam. IMPRESSION: Progressive bilateral airspace opacities compared with 09/25/2018 compatible with multifocal pneumonia. Electronically Signed   By: Kerby Moors M.D.   On: 09/29/2018 22:34    Procedures Procedures (  including critical care time)  Medications Ordered in ED Medications  enoxaparin (LOVENOX) injection 40 mg (40 mg Subcutaneous Given 10/01/18 0908)  hydroxychloroquine (PLAQUENIL) tablet 400 mg (400 mg Oral Given 09/30/18 0900)    Followed by  hydroxychloroquine (PLAQUENIL) tablet 200 mg (200 mg Oral Given 10/01/18 0907)  acetaminophen (TYLENOL) tablet 650 mg (650 mg Oral Given 10/01/18 0925)  insulin aspart (novoLOG) injection 0-15 Units (0 Units  Subcutaneous Not Given 10/01/18 1243)  allopurinol (ZYLOPRIM) tablet 100 mg (100 mg Oral Given 10/01/18 0908)  aspirin EC tablet 81 mg (81 mg Oral Given 10/01/18 0906)  atorvastatin (LIPITOR) tablet 5 mg (5 mg Oral Given 09/30/18 1753)  albuterol (PROVENTIL HFA;VENTOLIN HFA) 108 (90 Base) MCG/ACT inhaler 1-2 puff (has no administration in time range)  metoprolol succinate (TOPROL-XL) 24 hr tablet 50 mg (50 mg Oral Given 10/01/18 0906)  montelukast (SINGULAIR) tablet 10 mg (10 mg Oral Given 09/30/18 2118)  multivitamin with minerals tablet 1 tablet (1 tablet Oral Given 10/01/18 0905)  vitamin E capsule 100 Units (100 Units Oral Given 10/01/18 0905)  omega-3 acid ethyl esters (LOVAZA) capsule 1 g (1 g Oral Given 10/01/18 0908)  gabapentin (NEURONTIN) capsule 400 mg (400 mg Oral Given 10/02/29 5400)  folic acid (FOLVITE) tablet 1 mg (1 mg Oral Given 10/01/18 0908)  fluticasone (FLONASE) 50 MCG/ACT nasal spray 1 spray (has no administration in time range)  DULoxetine (CYMBALTA) DR capsule 30 mg (30 mg Oral Given 10/01/18 0905)  loratadine (CLARITIN) tablet 10 mg (10 mg Oral Given 10/01/18 0906)  colchicine tablet 0.6 mg (0.6 mg Oral Given 10/01/18 0906)  losartan (COZAAR) tablet 100 mg (100 mg Oral Given 09/30/18 2117)  fluticasone (FLOVENT HFA) 110 MCG/ACT inhaler 1 puff (1 puff Inhalation Given 10/01/18 0904)  guaiFENesin-dextromethorphan (ROBITUSSIN DM) 100-10 MG/5ML syrup 5 mL (has no administration in time range)  pantoprazole (PROTONIX) EC tablet 40 mg (40 mg Oral Given 10/01/18 0905)  0.9 %  sodium chloride infusion ( Intravenous New Bag/Given 09/30/18 1441)  ondansetron (ZOFRAN) injection 4 mg (4 mg Intravenous Given 09/29/18 2215)  acetaminophen (TYLENOL) tablet 650 mg (650 mg Oral Given 09/29/18 2216)  sodium chloride 0.9 % bolus 1,000 mL (0 mLs Intravenous Stopped 09/29/18 2354)  calcium gluconate 1 g/ 50 mL sodium chloride IVPB (1,000 mg Intravenous New Bag/Given 09/30/18 0022)  potassium chloride SA (K-DUR,KLOR-CON) CR  tablet 40 mEq (40 mEq Oral Given 10/01/18 0906)     Initial Impression / Assessment and Plan / ED Course  I have reviewed the triage vital signs and the nursing notes.  Pertinent labs & imaging results that were available during my care of the patient were reviewed by me and considered in my medical decision making (see chart for details).  Clinical Course as of Sep 30 1556  Tue Sep 29, 2018  2244 Consulted Dr. Alcario Drought for admission due to worsening Covid 19. Patient tachypnic and febrile. Septic workup pending   [KM]    Clinical Course User Index [KM] Alveria Apley, PA-C       CRITICAL CARE Performed by: Alveria Apley   Total critical care time:45 minutes  Critical care time was exclusive of separately billable procedures and treating other patients.  Critical care was necessary to treat or prevent imminent or life-threatening deterioration.  Critical care was time spent personally by me on the following activities: development of treatment plan with patient and/or surrogate as well as nursing, discussions with consultants, evaluation of patient's response to treatment, examination of patient,  obtaining history from patient or surrogate, ordering and performing treatments and interventions, ordering and review of laboratory studies, ordering and review of radiographic studies, pulse oximetry and re-evaluation of patient's condition.   Final Clinical Impressions(s) / ED Diagnoses   Final diagnoses:  Suspected Covid-19 Virus Infection    ED Discharge Orders    None       Kristine Royal 10/01/18 1558    Tegeler, Gwenyth Allegra, MD 10/02/18 [phone]

## 2018-09-30 ENCOUNTER — Other Ambulatory Visit: Payer: Self-pay

## 2018-09-30 DIAGNOSIS — J069 Acute upper respiratory infection, unspecified: Secondary | ICD-10-CM

## 2018-09-30 LAB — CBC WITH DIFFERENTIAL/PLATELET
Abs Immature Granulocytes: 0 10*3/uL (ref 0.00–0.07)
Basophils Absolute: 0 10*3/uL (ref 0.0–0.1)
Basophils Relative: 0 %
Eosinophils Absolute: 0 10*3/uL (ref 0.0–0.5)
Eosinophils Relative: 0 %
HEMATOCRIT: 30.2 % — AB (ref 36.0–46.0)
Hemoglobin: 10.4 g/dL — ABNORMAL LOW (ref 12.0–15.0)
LYMPHS ABS: 1.4 10*3/uL (ref 0.7–4.0)
LYMPHS PCT: 18 %
MCH: 34.1 pg — ABNORMAL HIGH (ref 26.0–34.0)
MCHC: 34.4 g/dL (ref 30.0–36.0)
MCV: 99 fL (ref 80.0–100.0)
Monocytes Absolute: 0.2 10*3/uL (ref 0.1–1.0)
Monocytes Relative: 2 %
Neutro Abs: 6.2 10*3/uL (ref 1.7–7.7)
Neutrophils Relative %: 80 %
Platelets: 238 10*3/uL (ref 150–400)
RBC: 3.05 MIL/uL — ABNORMAL LOW (ref 3.87–5.11)
RDW: 13.3 % (ref 11.5–15.5)
WBC: 7.8 10*3/uL (ref 4.0–10.5)
nRBC: 0 % (ref 0.0–0.2)

## 2018-09-30 LAB — COMPREHENSIVE METABOLIC PANEL
ALT: 27 U/L (ref 0–44)
AST: 41 U/L (ref 15–41)
Albumin: 2.7 g/dL — ABNORMAL LOW (ref 3.5–5.0)
Alkaline Phosphatase: 45 U/L (ref 38–126)
Anion gap: 9 (ref 5–15)
BUN: 14 mg/dL (ref 8–23)
CALCIUM: 8.4 mg/dL — AB (ref 8.9–10.3)
CO2: 22 mmol/L (ref 22–32)
Chloride: 102 mmol/L (ref 98–111)
Creatinine, Ser: 0.94 mg/dL (ref 0.44–1.00)
GFR calc Af Amer: >60 mL/min (ref >60)
GFR calc non Af Amer: 60 mL/min — ABNORMAL LOW (ref >60)
Glucose, Bld: 129 mg/dL — ABNORMAL HIGH (ref 70–99)
Potassium: 3.8 mmol/L (ref 3.5–5.1)
Sodium: 133 mmol/L — ABNORMAL LOW (ref 135–145)
Total Bilirubin: 0.9 mg/dL (ref 0.3–1.2)
Total Protein: 6.3 g/dL — ABNORMAL LOW (ref 6.5–8.1)

## 2018-09-30 LAB — C-REACTIVE PROTEIN: CRP: 21.4 mg/dL — ABNORMAL HIGH (ref <1.0)

## 2018-09-30 LAB — PROCALCITONIN: Procalcitonin: 0.13 ng/mL

## 2018-09-30 LAB — URINALYSIS, ROUTINE W REFLEX MICROSCOPIC
Bacteria, UA: NONE SEEN
Bilirubin Urine: NEGATIVE
Glucose, UA: NEGATIVE mg/dL
Hgb urine dipstick: NEGATIVE
Ketones, ur: 5 mg/dL — AB
Leukocytes,Ua: NEGATIVE
Nitrite: NEGATIVE
Protein, ur: >=300 mg/dL — AB
Specific Gravity, Urine: 1.03 (ref 1.005–1.030)
pH: 6 (ref 5.0–8.0)

## 2018-09-30 LAB — GLUCOSE, CAPILLARY
Glucose-Capillary: 138 mg/dL — ABNORMAL HIGH (ref 70–99)
Glucose-Capillary: 131 mg/dL — ABNORMAL HIGH (ref 70–99)
Glucose-Capillary: 118 mg/dL — ABNORMAL HIGH (ref 70–99)
Glucose-Capillary: 101 mg/dL — ABNORMAL HIGH (ref 70–99)

## 2018-09-30 MED ORDER — PANTOPRAZOLE SODIUM 40 MG PO TBEC
80.0000 mg | DELAYED_RELEASE_TABLET | Freq: Two times a day (BID) | ORAL | Status: DC
  Administered 2018-09-30: 80 mg via ORAL
  Filled 2018-09-30: qty 2

## 2018-09-30 MED ORDER — ATORVASTATIN CALCIUM 10 MG PO TABS
5.0000 mg | ORAL_TABLET | Freq: Every day | ORAL | Status: DC
  Administered 2018-09-30 – 2018-10-01 (×2): 5 mg via ORAL
  Filled 2018-09-30 (×2): qty 1

## 2018-09-30 MED ORDER — PANTOPRAZOLE SODIUM 40 MG PO TBEC
40.0000 mg | DELAYED_RELEASE_TABLET | Freq: Two times a day (BID) | ORAL | Status: DC
  Administered 2018-09-30 – 2018-10-02 (×4): 40 mg via ORAL
  Filled 2018-09-30 (×4): qty 1

## 2018-09-30 MED ORDER — ASPIRIN EC 81 MG PO TBEC
81.0000 mg | DELAYED_RELEASE_TABLET | Freq: Every day | ORAL | Status: DC
  Administered 2018-09-30 – 2018-10-02 (×3): 81 mg via ORAL
  Filled 2018-09-30 (×3): qty 1

## 2018-09-30 MED ORDER — GUAIFENESIN-DM 100-10 MG/5ML PO SYRP
5.0000 mL | ORAL_SOLUTION | ORAL | Status: DC | PRN
  Administered 2018-10-02: 5 mL via ORAL
  Filled 2018-09-30: qty 10

## 2018-09-30 MED ORDER — FLUTICASONE PROPIONATE 50 MCG/ACT NA SUSP: 1.0000 | Freq: Every day | NASAL | Status: DC | PRN

## 2018-09-30 MED ORDER — ADULT MULTIVITAMIN W/MINERALS CH
1.0000 | ORAL_TABLET | Freq: Every day | ORAL | Status: DC
  Administered 2018-09-30 – 2018-10-02 (×3): 1 via ORAL
  Filled 2018-09-30 (×3): qty 1

## 2018-09-30 MED ORDER — FLUTICASONE FUROATE 100 MCG/ACT IN AEPB: 1.0000 | INHALATION_SPRAY | Freq: Every day | RESPIRATORY_TRACT | Status: DC

## 2018-09-30 MED ORDER — SODIUM CHLORIDE 0.9 % IV SOLN: INTRAVENOUS | Status: DC

## 2018-09-30 MED ORDER — LOSARTAN POTASSIUM 50 MG PO TABS
100.0000 mg | ORAL_TABLET | Freq: Every day | ORAL | Status: DC
  Administered 2018-09-30 – 2018-10-01 (×3): 100 mg via ORAL
  Filled 2018-09-30 (×3): qty 2

## 2018-09-30 MED ORDER — PROMETHAZINE-DM 6.25-15 MG/5ML PO SYRP: 5.0000 mL | ORAL_SOLUTION | Freq: Four times a day (QID) | ORAL | Status: DC | PRN

## 2018-09-30 MED ORDER — HYDROCHLOROTHIAZIDE 12.5 MG PO CAPS
12.5000 mg | ORAL_CAPSULE | Freq: Every day | ORAL | Status: DC
  Administered 2018-09-30: 12.5 mg via ORAL
  Filled 2018-09-30: qty 1

## 2018-09-30 MED ORDER — SODIUM CHLORIDE 0.9 % IV SOLN
INTRAVENOUS | Status: AC
  Administered 2018-09-30: 15:00:00 via INTRAVENOUS

## 2018-09-30 MED ORDER — VITAMIN E 45 MG (100 UNIT) PO CAPS
100.0000 [IU] | ORAL_CAPSULE | Freq: Every day | ORAL | Status: DC
  Administered 2018-09-30 – 2018-10-02 (×3): 100 [IU] via ORAL
  Filled 2018-09-30 (×3): qty 1

## 2018-09-30 MED ORDER — ALBUTEROL SULFATE HFA 108 (90 BASE) MCG/ACT IN AERS
1.0000 | INHALATION_SPRAY | RESPIRATORY_TRACT | Status: DC | PRN
  Filled 2018-09-30: qty 6.7

## 2018-09-30 MED ORDER — FLAXSEED OIL 1000 MG PO CAPS: 1.0000 | ORAL_CAPSULE | Freq: Every day | ORAL | Status: DC

## 2018-09-30 MED ORDER — LOSARTAN POTASSIUM-HCTZ 100-12.5 MG PO TABS: 1.0000 | ORAL_TABLET | Freq: Every day | ORAL | Status: DC

## 2018-09-30 MED ORDER — METOPROLOL SUCCINATE ER 50 MG PO TB24
50.0000 mg | ORAL_TABLET | Freq: Every day | ORAL | Status: DC
  Administered 2018-09-30 – 2018-10-02 (×3): 50 mg via ORAL
  Filled 2018-09-30 (×4): qty 1

## 2018-09-30 MED ORDER — GABAPENTIN 400 MG PO CAPS
400.0000 mg | ORAL_CAPSULE | Freq: Two times a day (BID) | ORAL | Status: DC
  Administered 2018-09-30 – 2018-10-02 (×6): 400 mg via ORAL
  Filled 2018-09-30 (×6): qty 1

## 2018-09-30 MED ORDER — FOLIC ACID 1 MG PO TABS
1.0000 mg | ORAL_TABLET | Freq: Every day | ORAL | Status: DC
  Administered 2018-09-30 – 2018-10-02 (×3): 1 mg via ORAL
  Filled 2018-09-30 (×3): qty 1

## 2018-09-30 MED ORDER — FLUTICASONE PROPIONATE HFA 110 MCG/ACT IN AERO
1.0000 | INHALATION_SPRAY | Freq: Two times a day (BID) | RESPIRATORY_TRACT | Status: DC
  Administered 2018-09-30 – 2018-10-02 (×5): 1 via RESPIRATORY_TRACT
  Filled 2018-09-30 (×2): qty 12

## 2018-09-30 MED ORDER — DULOXETINE HCL 30 MG PO CPEP
30.0000 mg | ORAL_CAPSULE | Freq: Every day | ORAL | Status: DC
  Administered 2018-09-30 – 2018-10-02 (×3): 30 mg via ORAL
  Filled 2018-09-30 (×3): qty 1

## 2018-09-30 MED ORDER — LORATADINE 10 MG PO TABS
10.0000 mg | ORAL_TABLET | Freq: Every day | ORAL | Status: DC
  Administered 2018-09-30 – 2018-10-02 (×3): 10 mg via ORAL
  Filled 2018-09-30 (×3): qty 1

## 2018-09-30 MED ORDER — COLCHICINE 0.6 MG PO TABS
0.6000 mg | ORAL_TABLET | Freq: Every day | ORAL | Status: DC
  Administered 2018-09-30 – 2018-10-01 (×2): 0.6 mg via ORAL
  Filled 2018-09-30 (×3): qty 1

## 2018-09-30 MED ORDER — ALLOPURINOL 100 MG PO TABS
100.0000 mg | ORAL_TABLET | Freq: Every day | ORAL | Status: DC
  Administered 2018-09-30 – 2018-10-02 (×3): 100 mg via ORAL
  Filled 2018-09-30 (×4): qty 1

## 2018-09-30 MED ORDER — MONTELUKAST SODIUM 10 MG PO TABS
10.0000 mg | ORAL_TABLET | Freq: Every day | ORAL | Status: DC
  Administered 2018-09-30 – 2018-10-01 (×3): 10 mg via ORAL
  Filled 2018-09-30 (×3): qty 1

## 2018-09-30 MED ORDER — OMEGA-3-ACID ETHYL ESTERS 1 G PO CAPS
1.0000 g | ORAL_CAPSULE | Freq: Every day | ORAL | Status: DC
  Administered 2018-09-30 – 2018-10-02 (×3): 1 g via ORAL
  Filled 2018-09-30 (×4): qty 1

## 2018-09-30 NOTE — Progress Notes (Signed)
report received from [place] and pt received to room 1445 via strecther and self transferred to bed without difficulty. Education initiated on use of call bell for needs/safety and isolation, pt verbalized understanding

## 2018-09-30 NOTE — Progress Notes (Signed)
PHARMACIST - PHYSICIAN ORDER COMMUNICATION  CONCERNING: P&T Medication Policy on Herbal Medications  DESCRIPTION:  This patient's order for:  Flaxseed  has been noted.  This product(s) is classified as an "herbal" or natural product. Due to a lack of definitive safety studies or FDA approval, nonstandard manufacturing practices, plus the potential risk of unknown drug-drug interactions while on inpatient medications, the Pharmacy and Therapeutics Committee does not permit the use of "herbal" or natural products of this type within [facility].   ACTION TAKEN: The pharmacy department is unable to verify this order at this time and your patient has been informed of this safety policy. Please reevaluate patient's clinical condition at discharge and address if the herbal or natural product(s) should be resumed at that time.  Thanks Lawana Pai R 09/30/2018 1:10 AM

## 2018-09-30 NOTE — Plan of Care (Signed)
Care plan initiated.

## 2018-09-30 NOTE — ED Notes (Signed)
Patient signed the CORONAVIRUS DISEASE 2019 Guidance for Persons Under Investigation form.

## 2018-09-30 NOTE — Progress Notes (Signed)
PROGRESS NOTE    Megan Golden  LXB:[id number] DOB: 09-01-1945 DOA: 09/29/2018 PCP: Reita Cliche, MD  Brief Narrative: 73 y.o. female with medical history significant of DM2, HTN, RA on MTX. Presented to ED with fever, bodyaches, cough, weakness. Positive sick contact with COvid 65, lived with her brother in law who just died from Covid   Patient has been feeling ill with cough, SOB, diarrhea, body aches, fever.  Symptoms ongoing since around the 19th.  Persistent / worsening over the past week.  Saw PCP on Friday who was concerned for COVID 19   Assessment & Plan:   Principal Problem:   Acute respiratory disease due to COVID-19 virus -clinically meets all criteria for Covid 10, fever, productive cough, bilateral infiltrates and positive sick contact -Sars Covid 19 test pending -also had Covid PCR sent out to by PCP on Friday, called PCPs office, results pending at this time -admitting M.D. discussed with infectious disease Dr. Novella Olive and patient was started on hydroxy chloroquine, she is allergic to azithromycin hence this was not ordered -at this time continues to have diarrhea, we'll keep her on gentle IV fluids  Rheumatoid arthritis -Methotrexate on hold since last week due to acute illness/Infection  Hypertension -Holding Lasix, continue metoprolol, losartan  Type 2DM -oral hypoglycemics on hold, SSI  DVT prophylaxis: Lovenox Code Status: Full Code Family Communication:no family at bedside Disposition Plan: home likely tomorrow if fever curve improves and diarrhea improved  Consultants:      Procedures:   Antimicrobials:    Subjective: -feels a little better, still having some cough and chills, denies significant shortness of breath  Objective: Vitals:   09/30/18 0100 09/30/18 0127 09/30/18 0500 09/30/18 1224  BP: (!) 125/57 122/66 (!) 136/55 121/70  Pulse: 66 69 81 80  Resp: 18 (!) 27 14 15   Temp:  98.8 F (37.1 C) 99.3 F (37.4 C) 99.8 F (37.7 C)   TempSrc:  Oral Oral Oral  SpO2: 95% 95% 96% 94%  Weight:  76.7 kg    Height:  5' (1.524 m)      Intake/Output Summary (Last 24 hours) at 09/30/2018 1404 Last data filed at 09/30/2018 1301 Gross per 24 hour  Intake 0 ml  Output 0 ml  Net 0 ml   Filed Weights   09/30/18 0127  Weight: 76.7 kg    Examination:  General exam: frail chronically ill-appearing female AAO 3, no distress Respiratory system: bronchial breath sounds in both lower lobes Cardiovascular system: S1 & S2 heard, RRR. Gastrointestinal system: Abdomen is nondistended, soft and nontender.Normal bowel sounds heard. Central nervous system: Alert and oriented. No focal neurological deficits. Extremities: no edema Skin: No rashes, lesions or ulcers Psychiatry:Mood & affect appropriate.     Data Reviewed:   CBC: Recent Labs  Lab 09/29/18 2215 09/30/18 0419  WBC 8.1 7.8  NEUTROABS 7.1 6.2  HGB 11.6* 10.4*  HCT 34.5* 30.2*  MCV 100.0 99.0  PLT 299 416   Basic Metabolic Panel: Recent Labs  Lab 09/29/18 2215 09/30/18 0419  NA 133* 133*  K 4.0 3.8  CL 98 102  CO2 22 22  GLUCOSE 160* 129*  BUN 16 14  CREATININE 0.98 0.94  CALCIUM 7.7* 8.4*   GFR: Estimated Creatinine Clearance: 48.1 mL/min (by C-G formula based on SCr of 0.94 mg/dL). Liver Function Tests: Recent Labs  Lab 09/29/18 2215 09/30/18 0419  AST 52* 41  ALT 33 27  ALKPHOS 51 45  BILITOT 0.8 0.9  PROT 7.5 6.3*  ALBUMIN 3.1* 2.7*   No results for input(s): LIPASE, AMYLASE in the last 168 hours. No results for input(s): AMMONIA in the last 168 hours. Coagulation Profile: No results for input(s): INR, PROTIME in the last 168 hours. Cardiac Enzymes: No results for input(s): CKTOTAL, CKMB, CKMBINDEX, TROPONINI in the last 168 hours. BNP (last 3 results) No results for input(s): PROBNP in the last 8760 hours. HbA1C: No results for input(s): HGBA1C in the last 72 hours. CBG: Recent Labs  Lab 09/30/18 0842 09/30/18 1221  GLUCAP  138* 131*   Lipid Profile: No results for input(s): CHOL, HDL, LDLCALC, TRIG, CHOLHDL, LDLDIRECT in the last 72 hours. Thyroid Function Tests: No results for input(s): TSH, T4TOTAL, FREET4, T3FREE, THYROIDAB in the last 72 hours. Anemia Panel: No results for input(s): VITAMINB12, FOLATE, FERRITIN, TIBC, IRON, RETICCTPCT in the last 72 hours. Urine analysis:    Component Value Date/Time   COLORURINE AMBER (A) 09/30/2018 0058   APPEARANCEUR CLEAR 09/30/2018 0058   LABSPEC 1.030 09/30/2018 0058   PHURINE 6.0 09/30/2018 0058   GLUCOSEU NEGATIVE 09/30/2018 0058   HGBUR NEGATIVE 09/30/2018 0058   BILIRUBINUR NEGATIVE 09/30/2018 0058   KETONESUR 5 (A) 09/30/2018 0058   PROTEINUR >=300 (A) 09/30/2018 0058   NITRITE NEGATIVE 09/30/2018 0058   LEUKOCYTESUR NEGATIVE 09/30/2018 0058   Sepsis Labs: @LABRCNTIP (procalcitonin:4,lacticidven:4)  ) Recent Results (from the past 240 hour(s))  Blood culture (routine x 2)     Status: None (Preliminary result)   Collection Time: 09/29/18  9:57 PM  Result Value Ref Range Status   Specimen Description   Final    BLOOD RIGHT ARM Performed at [facility], [place] [address], [place], [place] [address]    Special Requests   Final    BOTTLES DRAWN AEROBIC AND ANAEROBIC Blood Culture adequate volume Performed at [place] [address], [place], [place] [address]    Culture   Final    NO GROWTH < 12 HOURS Performed at [place] [address] [place], [place] [address]    Report Status PENDING  Incomplete         Radiology Studies: Dg Chest Portable 1 View  Result Date: 09/29/2018 CLINICAL DATA:  Cough, fever.  Possible Covid exposure EXAM: PORTABLE CHEST 1 VIEW COMPARISON:  09/25/2018 FINDINGS: Normal heart size. Aortic atherosclerosis. Bilateral patchy airspace densities are again identified within the right upper lobe and left midlung. These appear increased when compared with previous  exam. IMPRESSION: Progressive bilateral airspace opacities compared with 09/25/2018 compatible with multifocal pneumonia. Electronically Signed   By: Kerby Moors M.D.   On: 09/29/2018 22:34        Scheduled Meds: . allopurinol  100 mg Oral Daily  . aspirin EC  81 mg Oral Daily  . atorvastatin  5 mg Oral q1800  . colchicine  0.6 mg Oral Daily  . DULoxetine  30 mg Oral Daily  . enoxaparin (LOVENOX) injection  40 mg Subcutaneous Daily  . fluticasone  1 puff Inhalation BID  . folic acid  1 mg Oral Daily  . gabapentin  400 mg Oral BID  . losartan  100 mg Oral QHS   And  . hydrochlorothiazide  12.5 mg Oral QHS  . hydroxychloroquine  200 mg Oral BID  . insulin aspart  0-15 Units Subcutaneous TID WC  . loratadine  10 mg Oral Daily  . metoprolol succinate  50 mg Oral Daily  . montelukast  10 mg Oral QHS  .  multivitamin with minerals  1 tablet Oral Daily  . omega-3 acid ethyl esters  1 g Oral Daily  . pantoprazole  40 mg Oral BID  . vitamin E  100 Units Oral Daily   Continuous Infusions:   LOS: 1 day    Time spent: 63min    Domenic Polite, MD [facility]  09/30/2018, 2:04 PM

## 2018-09-30 NOTE — ED Notes (Signed)
ED TO INPATIENT HANDOFF REPORT  ED Nurse Name and Phone #: jon wled   S Name/Age/Gender [name] 73 y.o. female Room/Bed: WA10/WA10  Code Status   Code Status: Full Code  Home/SNF/Other Home Patient oriented to: self, place, time and situation Is this baseline? Yes   Triage Complete: Triage complete  Chief Complaint COVID exposure; not feeling well  Triage Note Pt comes to ed via ems, c/o cough, sob and confirmed family member exposure. Family states Pts brother in law was a confirmed COVID19 exposure death. This in not supported and is story given by ems, and needs more information  V/s 124/80, pluse 73, 94 on room air, rr20., 159 cbg.    Allergies Allergies  Allergen Reactions  . Azithromycin Hives and Rash  . Ciprofloxacin Hives and Rash  . Hydrocodone-Acetaminophen Hives and Rash  . Levofloxacin Hives and Rash    Level of Care/Admitting Diagnosis ED Disposition    ED Disposition Condition [facility] [place] [facility]: [place] [[id number]]  Level of Care: Med-Surg [16]  Diagnosis: Acute Respiratory Disease due to Covid-19 Virus [[id number]]  Admitting Physician: Etta Quill [[id number]]  Attending Physician: Etta Quill [[id number]]  Estimated length of stay: past midnight tomorrow  Certification:: I certify this patient will need inpatient services for at least 2 midnights  Bed request comments: 'Low risk' covid-19, though she almost certianly has it  PT Class (Do Not Modify): Inpatient [101]  PT Acc Code (Do Not Modify): Private [1]       B Medical/Surgery History Past Medical History:  Diagnosis Date  . Asthma   . DM2 (diabetes mellitus, type 2) ([place])   . GERD (gastroesophageal reflux disease)   . HTN (hypertension)   . Pulmonary nodule   . RA (rheumatoid arthritis) ([facility])    RF positive   Past Surgical History:  Procedure Laterality Date  . BREAST LUMPECTOMY Left 2007  . ROTATOR CUFF REPAIR       A IV  Location/Drains/Wounds Patient Lines/Drains/Airways Status   Active Line/Drains/Airways    Name:   Placement date:   Placement time:   Site:   Days:   Peripheral IV 09/29/18 Right Arm   09/29/18    2154    Arm   1          Intake/Output Last 24 hours No intake or output data in the 24 hours ending 09/30/18 0049  Labs/Imaging Results for orders placed or performed during the hospital encounter of 09/29/18 (from the past 48 hour(s))  Comprehensive metabolic panel     Status: Abnormal   Collection Time: 09/29/18 10:15 PM  Result Value Ref Range   Sodium 133 (L) 135 - 145 mmol/L   Potassium 4.0 3.5 - 5.1 mmol/L   Chloride 98 98 - 111 mmol/L   CO2 22 22 - 32 mmol/L   Glucose, Bld 160 (H) 70 - 99 mg/dL   BUN 16 8 - 23 mg/dL   Creatinine, Ser 0.98 0.44 - 1.00 mg/dL   Calcium 7.7 (L) 8.9 - 10.3 mg/dL   Total Protein 7.5 6.5 - 8.1 g/dL   Albumin 3.1 (L) 3.5 - 5.0 g/dL   AST 52 (H) 15 - 41 U/L   ALT 33 0 - 44 U/L   Alkaline Phosphatase 51 38 - 126 U/L   Total Bilirubin 0.8 0.3 - 1.2 mg/dL   GFR calc non Af Amer 57 (L) >60 mL/min   GFR calc Af Amer >60 >60  mL/min   Anion gap 13 5 - 15    Comment: Performed at [facility], [place] [address] [place], [place] [address]  CBC with Differential     Status: Abnormal   Collection Time: 09/29/18 10:15 PM  Result Value Ref Range   WBC 8.1 4.0 - 10.5 K/uL   RBC 3.45 (L) 3.87 - 5.11 MIL/uL   Hemoglobin 11.6 (L) 12.0 - 15.0 g/dL   HCT 34.5 (L) 36.0 - 46.0 %   MCV 100.0 80.0 - 100.0 fL   MCH 33.6 26.0 - 34.0 pg   MCHC 33.6 30.0 - 36.0 g/dL   RDW 13.2 11.5 - 15.5 %   Platelets 299 150 - 400 K/uL   nRBC 0.0 0.0 - 0.2 %   Neutrophils Relative % 87 %   Neutro Abs 7.1 1.7 - 7.7 K/uL   Band Neutrophils 1 %   Lymphocytes Relative 11 %   Lymphs Abs 0.9 0.7 - 4.0 K/uL   Monocytes Relative 1 %   Monocytes Absolute 0.1 0.1 - 1.0 K/uL   Eosinophils Relative 0 %   Eosinophils Absolute 0.0 0.0 - 0.5 K/uL   Basophils Relative 0 %    Basophils Absolute 0.0 0.0 - 0.1 K/uL   Abs Immature Granulocytes 0.00 0.00 - 0.07 K/uL    Comment: Performed at [facility], [place] [address], [place], [place] [address]  Lactic acid, plasma     Status: None   Collection Time: 09/29/18 10:15 PM  Result Value Ref Range   Lactic Acid, Venous 1.7 0.5 - 1.9 mmol/L    Comment: Performed at  [facility], [place] [address], [place], [place] [address]  Procalcitonin     Status: None   Collection Time: 09/29/18 11:37 PM  Result Value Ref Range   Procalcitonin 0.13 ng/mL    Comment:        Interpretation: PCT (Procalcitonin) <= 0.5 ng/mL: Systemic infection (sepsis) is not likely. Local bacterial infection is possible. (NOTE)       Sepsis PCT Algorithm           Lower Respiratory Tract                                      Infection PCT Algorithm    ----------------------------     ----------------------------         PCT < 0.25 ng/mL                PCT < 0.10 ng/mL         Strongly encourage             Strongly discourage   discontinuation of antibiotics    initiation of antibiotics    ----------------------------     -----------------------------       PCT 0.25 - 0.50 ng/mL            PCT 0.10 - 0.25 ng/mL               OR       >80% decrease in PCT            Discourage initiation of                                            antibiotics  Encourage discontinuation           of antibiotics    ----------------------------     -----------------------------         PCT >= 0.50 ng/mL              PCT 0.26 - 0.50 ng/mL               AND        <80% decrease in PCT             Encourage initiation of                                             antibiotics       Encourage continuation           of antibiotics    ----------------------------     -----------------------------        PCT >= 0.50 ng/mL                  PCT > 0.50 ng/mL               AND         increase in PCT                  Strongly  encourage                                      initiation of antibiotics    Strongly encourage escalation           of antibiotics                                     -----------------------------                                           PCT <= 0.25 ng/mL                                                 OR                                        > 80% decrease in PCT                                     Discontinue / Do not initiate                                             antibiotics Performed at [place] [address], [place], [place] [address]   D-dimer, quantitative (not at [facility])     Status: Abnormal   Collection Time: 09/29/18 11:37 PM  Result Value Ref Range   D-Dimer, Quant 1.51 (H) 0.00 - 0.50 ug/mL-FEU    Comment: (NOTE) At the manufacturer cut-off of 0.50 ug/mL FEU, this assay has been documented to exclude PE with a sensitivity and negative predictive value of 97 to 99%.  At this time, this assay has not been approved by the FDA to exclude DVT/VTE. Results should be correlated with clinical presentation. Performed at [facility], [place] [address] [place], [place] [address]   C-reactive protein     Status: Abnormal   Collection Time: 09/29/18 11:37 PM  Result Value Ref Range   CRP 21.4 (H) <1.0 mg/dL    Comment: Performed at [facility], [place] [address] [place],  [address]   Dg Chest Portable 1 View  Result Date: 09/29/2018 CLINICAL DATA:  Cough, fever.  Possible Covid exposure EXAM: PORTABLE CHEST 1 VIEW COMPARISON:  09/25/2018 FINDINGS: Normal heart size. Aortic atherosclerosis. Bilateral patchy airspace densities are again identified within the right upper lobe and left midlung. These appear increased when compared with previous exam. IMPRESSION: Progressive bilateral airspace opacities compared with 09/25/2018 compatible with multifocal pneumonia. Electronically Signed   By: Kerby Moors M.D.    On: 09/29/2018 22:34    Pending Labs Unresulted Labs (From admission, onward)    Start     Ordered   09/30/18 0500  CBC with Differential/Platelet  Daily,   R     09/29/18 2340   09/30/18 0500  Comprehensive metabolic panel  Daily,   R     09/29/18 2340   09/29/18 2331  Novel Coronavirus, NAA (hospital order; send-out to ref lab)  (CH[facility] IP COVID ADMIT NOVEL CORONAVIRUS, NAA ([place]; SEND-OUT TO REF LAB))  Once,   R    Question Answer Comment  Current symptoms Fever and Cough   Excluded other viral illnesses Yes   Exposure Risk Contact with a known COVID19 positive person in the last 14 days   Patient immune status Immunocompromised      09/29/18 2340   09/29/18 2157  Blood culture (routine x 2)  BLOOD CULTURE X 2,   STAT     09/29/18 2156          Vitals/Pain Today's Vitals   09/29/18 2330 09/30/18 0000 09/30/18 0020 09/30/18 0030  BP: 129/61 (!) 125/95  140/63  Pulse: 69 69  67  Resp:   18 18  Temp:   98.3 F (36.8 C)   TempSrc:   Oral   SpO2: 96% 95%  97%  PainSc:    5     Isolation Precautions Droplet and Contact precautions  Medications Medications  enoxaparin (LOVENOX) injection 40 mg (has no administration in time range)  hydroxychloroquine (PLAQUENIL) tablet 400 mg (400 mg Oral Given 09/30/18 0026)    Followed by  hydroxychloroquine (PLAQUENIL) tablet 200 mg (has no administration in time range)  acetaminophen (TYLENOL) tablet 650 mg (has no administration in time range)  insulin aspart (novoLOG) injection 0-15 Units (has no administration in time range)  calcium gluconate 1 g/ 50 mL sodium chloride IVPB (1,000 mg Intravenous New Bag/Given 09/30/18 0022)  allopurinol (ZYLOPRIM) tablet 100 mg (has no administration in time range)  aspirin EC tablet 81 mg (has no administration in time range)  atorvastatin (LIPITOR) tablet 5 mg (has no administration in time range)  albuterol (PROVENTIL HFA;VENTOLIN HFA) 108 (90 Base) MCG/ACT inhaler 1-2 puff (has no  administration in time range)  metoprolol succinate (TOPROL-XL) 24 hr tablet  50 mg (has no administration in time range)  montelukast (SINGULAIR) tablet 10 mg (has no administration in time range)  multivitamin with minerals tablet 1 tablet (has no administration in time range)  vitamin E capsule 100 Units (has no administration in time range)  promethazine-dextromethorphan (PROMETHAZINE-DM) 6.25-15 MG/5ML syrup 5 mL (has no administration in time range)  pantoprazole (PROTONIX) EC tablet 80 mg (has no administration in time range)  omega-3 acid ethyl esters (LOVAZA) capsule 1 g (has no administration in time range)  gabapentin (NEURONTIN) capsule 400 mg (has no administration in time range)  folic acid (FOLVITE) tablet 1 mg (has no administration in time range)  Fluticasone Furoate AEPB 1 puff (has no administration in time range)  fluticasone (FLONASE) 50 MCG/ACT nasal spray 1 spray (has no administration in time range)  Flaxseed Oil CAPS 1,000 mg (has no administration in time range)  DULoxetine (CYMBALTA) DR capsule 30 mg (has no administration in time range)  loratadine (CLARITIN) tablet 10 mg (has no administration in time range)  Colchicine CAPS 1 capsule (has no administration in time range)  losartan (COZAAR) tablet 100 mg (has no administration in time range)    And  hydrochlorothiazide (MICROZIDE) capsule 12.5 mg (has no administration in time range)  ondansetron (ZOFRAN) injection 4 mg (4 mg Intravenous Given 09/29/18 2215)  acetaminophen (TYLENOL) tablet 650 mg (650 mg Oral Given 09/29/18 2216)  sodium chloride 0.9 % bolus 1,000 mL (0 mLs Intravenous Stopped 09/29/18 2354)    Mobility walks Low fall risk   Focused Assessments Respiratory assessment     R Recommendations: See Admitting Provider Note  Report given to:   Additional Notes:  COVID 19 possible

## 2018-09-30 NOTE — ED Notes (Signed)
Assisted patient with using the bedside commode. She urinated and obtained a specimen. Also, changed patient into a gown and clean diaper.

## 2018-10-01 LAB — CBC WITH DIFFERENTIAL/PLATELET
Abs Immature Granulocytes: 0.06 10*3/uL (ref 0.00–0.07)
Basophils Absolute: 0 10*3/uL (ref 0.0–0.1)
Basophils Relative: 0 %
Eosinophils Absolute: 0 10*3/uL (ref 0.0–0.5)
Eosinophils Relative: 1 %
HCT: 32 % — ABNORMAL LOW (ref 36.0–46.0)
Hemoglobin: 10.5 g/dL — ABNORMAL LOW (ref 12.0–15.0)
Immature Granulocytes: 1 %
Lymphocytes Relative: 22 %
Lymphs Abs: 1.4 10*3/uL (ref 0.7–4.0)
MCH: 33.2 pg (ref 26.0–34.0)
MCHC: 32.8 g/dL (ref 30.0–36.0)
MCV: 101.3 fL — ABNORMAL HIGH (ref 80.0–100.0)
Monocytes Absolute: 0.4 10*3/uL (ref 0.1–1.0)
Monocytes Relative: 7 %
Neutro Abs: 4.6 10*3/uL (ref 1.7–7.7)
Neutrophils Relative %: 69 %
Platelets: 290 10*3/uL (ref 150–400)
RBC: 3.16 MIL/uL — ABNORMAL LOW (ref 3.87–5.11)
RDW: 13.2 % (ref 11.5–15.5)
WBC: 6.5 10*3/uL (ref 4.0–10.5)
nRBC: 0 % (ref 0.0–0.2)

## 2018-10-01 LAB — GLUCOSE, CAPILLARY
Glucose-Capillary: 111 mg/dL — ABNORMAL HIGH (ref 70–99)
Glucose-Capillary: 113 mg/dL — ABNORMAL HIGH (ref 70–99)
Glucose-Capillary: 104 mg/dL — ABNORMAL HIGH (ref 70–99)
Glucose-Capillary: 114 mg/dL — ABNORMAL HIGH (ref 70–99)

## 2018-10-01 LAB — COMPREHENSIVE METABOLIC PANEL
ALT: 36 U/L (ref 0–44)
AST: 52 U/L — ABNORMAL HIGH (ref 15–41)
Albumin: 2.4 g/dL — ABNORMAL LOW (ref 3.5–5.0)
Alkaline Phosphatase: 49 U/L (ref 38–126)
Anion gap: 11 (ref 5–15)
BUN: 12 mg/dL (ref 8–23)
CO2: 22 mmol/L (ref 22–32)
Calcium: 8.1 mg/dL — ABNORMAL LOW (ref 8.9–10.3)
Chloride: 100 mmol/L (ref 98–111)
Creatinine, Ser: 0.84 mg/dL (ref 0.44–1.00)
GFR calc Af Amer: >60 mL/min (ref >60)
GFR calc non Af Amer: >60 mL/min (ref >60)
Glucose, Bld: 112 mg/dL — ABNORMAL HIGH (ref 70–99)
Potassium: 3 mmol/L — ABNORMAL LOW (ref 3.5–5.1)
Sodium: 133 mmol/L — ABNORMAL LOW (ref 135–145)
Total Bilirubin: 0.8 mg/dL (ref 0.3–1.2)
Total Protein: 6.2 g/dL — ABNORMAL LOW (ref 6.5–8.1)

## 2018-10-01 LAB — URINE CULTURE: Culture: NO GROWTH

## 2018-10-01 MED ORDER — POTASSIUM CHLORIDE CRYS ER 20 MEQ PO TBCR
40.0000 meq | EXTENDED_RELEASE_TABLET | Freq: Once | ORAL | Status: AC
  Administered 2018-10-01: 40 meq via ORAL
  Filled 2018-10-01: qty 2

## 2018-10-01 NOTE — Progress Notes (Signed)
PROGRESS NOTE    Megan Golden  TDD:[id number] DOB: Mar 31, 1946 DOA: 09/29/2018 PCP: Reita Cliche, MD  Brief Narrative: 73 y.o. female with medical history significant of DM2, HTN, RA on MTX. Presented to ED with fever, bodyaches, cough, weakness. Positive sick contact with COvid 70, lived with her brother in law who just died from Covid   Patient has been feeling ill with cough, SOB, diarrhea, body aches, fever.  Symptoms ongoing since around the 19th.  Persistent / worsening over the past week.  Saw PCP on Friday who was concerned for COVID 19  Assessment & Plan:     Acute respiratory disease due to COVID-19 virus -clinically meets all criteria for Covid 10, fever, productive cough, bilateral infiltrates and positive sick contact -fortunately continues to be stable from a respiratory standpoint, no O2 requirement -Sars Covid 19 test pending -also had Covid PCR sent out to by PCP on Friday, called PCPs office yesterday, results pending at this time -admitting M.D. discussed with infectious disease Dr. Novella Olive and patient was started on hydroxy chloroquine, she is allergic to azithromycin hence this was not ordered -unfortunately at this time continues to have severe nausea, diarrhea and nopossible poor by mouth intake  Rheumatoid arthritis -Methotrexate on hold since last week due to acute illness/Infection  Hypertensionwith -Holding Lasix, continue metoprolol, losartan  Type 2DM -oral hypoglycemics on hold, SSI  DVT prophylaxis: Lovenox Code Status: Full Code Family Communication:no family at bedside Disposition Plan: home tomorrow if severe nausea/diarrhea improves and able to tolerate some by mouth  Consultants:      Procedures:   Antimicrobials:    Subjective: -denies dyspnea, reports a mild cough, continues to have diarrhea and severe nausea, no further vomiting, unable to eat anything for at least past 24 hours Objective: Vitals:   09/30/18 0500 09/30/18 1224  09/30/18 2128 10/01/18 0410  BP: (!) 136/55 121/70 137/68 136/71  Pulse: 81 80 65 66  Resp: 14 15 15 15   Temp: 99.3 F (37.4 C) 99.8 F (37.7 C) 98.7 F (37.1 C) 98.3 F (36.8 C)  TempSrc: Oral Oral Oral Oral  SpO2: 96% 94% 95% 96%  Weight:      Height:        Intake/Output Summary (Last 24 hours) at 10/01/2018 1302 Last data filed at 10/01/2018 0200 Gross per 24 hour  Intake 585.83 ml  Output 0 ml  Net 585.83 ml   Filed Weights   09/30/18 0127  Weight: 76.7 kg    Examination:  Gen: frail chronically ill-appearing female, AAO 3, no distress HEENT: PERRLA, Neck supple, no JVD Lungs: bronchial breath sounds at both lower lobes CVS: RRR,No Gallops,Rubs or new Murmurs Abd: soft, Non tender, non distended, BS present Extremities: No edema Skin: no new rashes Psychiatry:Mood & affect appropriate.     Data Reviewed:   CBC: Recent Labs  Lab 09/29/18 2215 09/30/18 0419 10/01/18 0403  WBC 8.1 7.8 6.5  NEUTROABS 7.1 6.2 4.6  HGB 11.6* 10.4* 10.5*  HCT 34.5* 30.2* 32.0*  MCV 100.0 99.0 101.3*  PLT 299 238 623   Basic Metabolic Panel: Recent Labs  Lab 09/29/18 2215 09/30/18 0419 10/01/18 0403  NA 133* 133* 133*  K 4.0 3.8 3.0*  CL 98 102 100  CO2 22 22 22   GLUCOSE 160* 129* 112*  BUN 16 14 12   CREATININE 0.98 0.94 0.84  CALCIUM 7.7* 8.4* 8.1*   GFR: Estimated Creatinine Clearance: 53.8 mL/min (by C-G formula based on SCr of 0.84 mg/dL). Liver Function  Tests: Recent Labs  Lab 09/29/18 2215 09/30/18 0419 10/01/18 0403  AST 52* 41 52*  ALT 33 27 36  ALKPHOS 51 45 49  BILITOT 0.8 0.9 0.8  PROT 7.5 6.3* 6.2*  ALBUMIN 3.1* 2.7* 2.4*   No results for input(s): LIPASE, AMYLASE in the last 168 hours. No results for input(s): AMMONIA in the last 168 hours. Coagulation Profile: No results for input(s): INR, PROTIME in the last 168 hours. Cardiac Enzymes: No results for input(s): CKTOTAL, CKMB, CKMBINDEX, TROPONINI in the last 168 hours. BNP (last 3  results) No results for input(s): PROBNP in the last 8760 hours. HbA1C: No results for input(s): HGBA1C in the last 72 hours. CBG: Recent Labs  Lab 09/30/18 1221 09/30/18 1747 09/30/18 2124 10/01/18 0855 10/01/18 1236  GLUCAP 131* 118* 101* 111* 113*   Lipid Profile: No results for input(s): CHOL, HDL, LDLCALC, TRIG, CHOLHDL, LDLDIRECT in the last 72 hours. Thyroid Function Tests: No results for input(s): TSH, T4TOTAL, FREET4, T3FREE, THYROIDAB in the last 72 hours. Anemia Panel: No results for input(s): VITAMINB12, FOLATE, FERRITIN, TIBC, IRON, RETICCTPCT in the last 72 hours. Urine analysis:    Component Value Date/Time   COLORURINE AMBER (A) 09/30/2018 0058   APPEARANCEUR CLEAR 09/30/2018 0058   LABSPEC 1.030 09/30/2018 0058   PHURINE 6.0 09/30/2018 0058   GLUCOSEU NEGATIVE 09/30/2018 0058   HGBUR NEGATIVE 09/30/2018 0058   BILIRUBINUR NEGATIVE 09/30/2018 0058   KETONESUR 5 (A) 09/30/2018 0058   PROTEINUR >=300 (A) 09/30/2018 0058   NITRITE NEGATIVE 09/30/2018 0058   LEUKOCYTESUR NEGATIVE 09/30/2018 0058   Sepsis Labs: @LABRCNTIP (procalcitonin:4,lacticidven:4)  ) Recent Results (from the past 240 hour(s))  Blood culture (routine x 2)     Status: None (Preliminary result)   Collection Time: 09/29/18  9:57 PM  Result Value Ref Range Status   Specimen Description   Final    BLOOD RIGHT ARM Performed at [facility], [place] [address] [place], [place] [address]    Special Requests   Final    BOTTLES DRAWN AEROBIC AND ANAEROBIC Blood Culture adequate volume Performed at [place] [address], [place], [place] [address]    Culture   Final    NO GROWTH < 12 HOURS Performed at [place] [address], [place], [place] [address]    Report Status PENDING  Incomplete  Urine culture     Status: None   Collection Time: 09/30/18 12:58 AM  Result Value Ref Range Status   Specimen Description   Final    URINE,  RANDOM Performed at [place] [address] [place], [place] [address]    Special Requests   Final    NONE Performed at [facility], [place] [address] [place], [place] [address]    Culture   Final    NO GROWTH Performed at [facility], [place] [address], [place], [place] [address]    Report Status 10/01/2018 FINAL  Final         Radiology Studies: Dg Chest Portable 1 View  Result Date: 09/29/2018 CLINICAL DATA:  Cough, fever.  Possible Covid exposure EXAM: PORTABLE CHEST 1 VIEW COMPARISON:  09/25/2018 FINDINGS: Normal heart size. Aortic atherosclerosis. Bilateral patchy airspace densities are again identified within the right upper lobe and left midlung. These appear increased when compared with previous exam. IMPRESSION: Progressive bilateral airspace opacities compared with 09/25/2018 compatible with multifocal pneumonia. Electronically Signed   By: Queen Slough.D.  On: 09/29/2018 22:34        Scheduled Meds:  allopurinol  100 mg Oral Daily   aspirin EC  81 mg Oral Daily   atorvastatin  5 mg Oral q1800   colchicine  0.6 mg Oral Daily   DULoxetine  30 mg Oral Daily   enoxaparin (LOVENOX) injection  40 mg Subcutaneous Daily   fluticasone  1 puff Inhalation BID   folic acid  1 mg Oral Daily   gabapentin  400 mg Oral BID   hydroxychloroquine  200 mg Oral BID   insulin aspart  0-15 Units Subcutaneous TID WC   loratadine  10 mg Oral Daily   losartan  100 mg Oral QHS   metoprolol succinate  50 mg Oral Daily   montelukast  10 mg Oral QHS   multivitamin with minerals  1 tablet Oral Daily   omega-3 acid ethyl esters  1 g Oral Daily   pantoprazole  40 mg Oral BID   vitamin E  100 Units Oral Daily   Continuous Infusions:   LOS: 2 days    Time spent: 56min    Domenic Polite, MD [facility]  10/01/2018, 1:02 PM

## 2018-10-02 LAB — CBC WITH DIFFERENTIAL/PLATELET
Abs Immature Granulocytes: 0 10*3/uL (ref 0.00–0.07)
Basophils Absolute: 0 10*3/uL (ref 0.0–0.1)
Basophils Relative: 0 %
Eosinophils Absolute: 0 10*3/uL (ref 0.0–0.5)
Eosinophils Relative: 0 %
HCT: 30.3 % — ABNORMAL LOW (ref 36.0–46.0)
Hemoglobin: 10.4 g/dL — ABNORMAL LOW (ref 12.0–15.0)
Lymphocytes Relative: 18 %
Lymphs Abs: 1.2 10*3/uL (ref 0.7–4.0)
MCH: 34.3 pg — ABNORMAL HIGH (ref 26.0–34.0)
MCHC: 34.3 g/dL (ref 30.0–36.0)
MCV: 100 fL (ref 80.0–100.0)
Monocytes Absolute: 0.3 10*3/uL (ref 0.1–1.0)
Monocytes Relative: 4 %
Neutro Abs: 5 10*3/uL (ref 1.7–7.7)
Neutrophils Relative %: 78 %
Platelets: 367 10*3/uL (ref 150–400)
RBC: 3.03 MIL/uL — ABNORMAL LOW (ref 3.87–5.11)
RDW: 13.3 % (ref 11.5–15.5)
WBC: 6.4 10*3/uL (ref 4.0–10.5)
nRBC: 0 % (ref 0.0–0.2)

## 2018-10-02 LAB — COMPREHENSIVE METABOLIC PANEL
ALT: 40 U/L (ref 0–44)
AST: 48 U/L — ABNORMAL HIGH (ref 15–41)
Albumin: 2.4 g/dL — ABNORMAL LOW (ref 3.5–5.0)
Alkaline Phosphatase: 63 U/L (ref 38–126)
Anion gap: 8 (ref 5–15)
BUN: 12 mg/dL (ref 8–23)
CO2: 23 mmol/L (ref 22–32)
Calcium: 8.2 mg/dL — ABNORMAL LOW (ref 8.9–10.3)
Chloride: 106 mmol/L (ref 98–111)
Creatinine, Ser: 0.81 mg/dL (ref 0.44–1.00)
GFR calc Af Amer: >60 mL/min (ref >60)
GFR calc non Af Amer: >60 mL/min (ref >60)
Glucose, Bld: 122 mg/dL — ABNORMAL HIGH (ref 70–99)
Potassium: 3.7 mmol/L (ref 3.5–5.1)
Sodium: 137 mmol/L (ref 135–145)
Total Bilirubin: 0.8 mg/dL (ref 0.3–1.2)
Total Protein: 6.4 g/dL — ABNORMAL LOW (ref 6.5–8.1)

## 2018-10-02 LAB — GLUCOSE, CAPILLARY
Glucose-Capillary: 118 mg/dL — ABNORMAL HIGH (ref 70–99)
Glucose-Capillary: 146 mg/dL — ABNORMAL HIGH (ref 70–99)

## 2018-10-02 LAB — NOVEL CORONAVIRUS, NAA (HOSPITAL ORDER, SEND-OUT TO REF LAB)

## 2018-10-02 LAB — NOVEL CORONAVIRUS, NAA (HOSP ORDER, SEND-OUT TO REF LAB; TAT 18-24 HRS): SARS-CoV-2, NAA: DETECTED — AB

## 2018-10-02 MED ORDER — POTASSIUM CHLORIDE CRYS ER 10 MEQ PO TBCR: 10.0000 meq | EXTENDED_RELEASE_TABLET | Freq: Two times a day (BID) | ORAL | Status: DC

## 2018-10-02 MED ORDER — METFORMIN HCL ER 500 MG PO TB24: 500.0000 mg | ORAL_TABLET | Freq: Two times a day (BID) | ORAL | Status: AC

## 2018-10-02 MED ORDER — ONDANSETRON HCL 4 MG/2ML IJ SOLN
4.0000 mg | Freq: Four times a day (QID) | INTRAMUSCULAR | Status: DC | PRN
  Administered 2018-10-02: 4 mg via INTRAVENOUS
  Filled 2018-10-02: qty 2

## 2018-10-02 MED ORDER — FUROSEMIDE 20 MG PO TABS: 20.0000 mg | ORAL_TABLET | Freq: Every day | ORAL | Status: DC

## 2018-10-02 NOTE — Progress Notes (Signed)
PROGRESS NOTE    Megan Golden  SHF:[id number] DOB: 1945/10/24 DOA: 09/29/2018 PCP: Reita Cliche, MD  Brief Narrative: 73 y.o. female with medical history significant of DM2, HTN, RA on MTX. Presented to ED with fever, bodyaches, cough, weakness. Positive sick contact with COvid 107, lived with her brother in law who just died from Covid   Patient has been feeling ill with cough, SOB, diarrhea, body aches, fever.  Symptoms ongoing since around the 19th.  Persistent / worsening over the past week.  Saw PCP on Friday who was concerned for COVID 19  Assessment & Plan:     Acute respiratory disease due to COVID-19 virus -clinically meets all criteria for Covid 10, fever, productive cough, bilateral infiltrates and positive sick contact -fortunately continues to be stable from a respiratory standpoint, no O2 requirement -Sars Covid 19 test is positive -admitting M.D. discussed with infectious disease and patient was started on hydroxy chloroquine, she is allergic to azithromycin hence this was not ordered -unfortunately at this time continues to have severe nausea some of which could be due to hydroxychloroquine, will stop this and monitor  Rheumatoid arthritis -Methotrexate on hold since last week due to acute illness/Infection  Hypertensionwith -Holding Lasix, continue metoprolol, losartan  Type 2DM -oral hypoglycemics on hold, SSI  DVT prophylaxis: Lovenox Code Status: Full Code Family Communication:no family at bedside Disposition Plan: home later today or tomorrow if nausea, improves and able to tolerate PO  Consultants:      Procedures:   Antimicrobials:    Subjective: -mild cough, minimal dyspnea with activity, Still with severe nausea, poor Po intake   Objective: Vitals:   10/01/18 0410 10/01/18 1536 10/01/18 2045 10/02/18 0439  BP: 136/71 (!) 143/65 (!) 147/77 (!) 142/65  Pulse: 66 63 65 68  Resp: 15  18 18   Temp: 98.3 F (36.8 C) 97.8 F (36.6 C) 98.6 F  (37 C) 98.5 F (36.9 C)  TempSrc: Oral Oral Oral Oral  SpO2: 96% 96% 94% 96%  Weight:      Height:        Intake/Output Summary (Last 24 hours) at 10/02/2018 1049 Last data filed at 10/01/2018 2250 Gross per 24 hour  Intake 300 ml  Output -  Net 300 ml   Filed Weights   09/30/18 0127  Weight: 76.7 kg    Examination:  Gen: Awake, Alert, Oriented X 3, no distress HEENT: PERRLA, Neck supple, no JVD Lungs: bronchial breath sounds B/L CVS: S1S2/RRR Abd: soft, Non tender, non distended, BS present Extremities: No Cyanosis, Clubbing or edema Skin: no new rashes Psychiatry:Mood & affect appropriate.     Data Reviewed:   CBC: Recent Labs  Lab 09/29/18 2215 09/30/18 0419 10/01/18 0403 10/02/18 0344  WBC 8.1 7.8 6.5 6.4  NEUTROABS 7.1 6.2 4.6 5.0  HGB 11.6* 10.4* 10.5* 10.4*  HCT 34.5* 30.2* 32.0* 30.3*  MCV 100.0 99.0 101.3* 100.0  PLT 299 238 290 502   Basic Metabolic Panel: Recent Labs  Lab 09/29/18 2215 09/30/18 0419 10/01/18 0403 10/02/18 0344  NA 133* 133* 133* 137  K 4.0 3.8 3.0* 3.7  CL 98 102 100 106  CO2 22 22 22 23   GLUCOSE 160* 129* 112* 122*  BUN 16 14 12 12   CREATININE 0.98 0.94 0.84 0.81  CALCIUM 7.7* 8.4* 8.1* 8.2*   GFR: Estimated Creatinine Clearance: 55.8 mL/min (by C-G formula based on SCr of 0.81 mg/dL). Liver Function Tests: Recent Labs  Lab 09/29/18 2215 09/30/18 0419 10/01/18 0403 10/02/18  0344  AST 52* 41 52* 48*  ALT 33 27 36 40  ALKPHOS 51 45 49 63  BILITOT 0.8 0.9 0.8 0.8  PROT 7.5 6.3* 6.2* 6.4*  ALBUMIN 3.1* 2.7* 2.4* 2.4*   No results for input(s): LIPASE, AMYLASE in the last 168 hours. No results for input(s): AMMONIA in the last 168 hours. Coagulation Profile: No results for input(s): INR, PROTIME in the last 168 hours. Cardiac Enzymes: No results for input(s): CKTOTAL, CKMB, CKMBINDEX, TROPONINI in the last 168 hours. BNP (last 3 results) No results for input(s): PROBNP in the last 8760 hours. HbA1C: No  results for input(s): HGBA1C in the last 72 hours. CBG: Recent Labs  Lab 10/01/18 0855 10/01/18 1236 10/01/18 1634 10/01/18 2118 10/02/18 0732  GLUCAP 111* 113* 104* 114* 118*   Lipid Profile: No results for input(s): CHOL, HDL, LDLCALC, TRIG, CHOLHDL, LDLDIRECT in the last 72 hours. Thyroid Function Tests: No results for input(s): TSH, T4TOTAL, FREET4, T3FREE, THYROIDAB in the last 72 hours. Anemia Panel: No results for input(s): VITAMINB12, FOLATE, FERRITIN, TIBC, IRON, RETICCTPCT in the last 72 hours. Urine analysis:    Component Value Date/Time   COLORURINE AMBER (A) 09/30/2018 0058   APPEARANCEUR CLEAR 09/30/2018 0058   LABSPEC 1.030 09/30/2018 0058   PHURINE 6.0 09/30/2018 0058   GLUCOSEU NEGATIVE 09/30/2018 0058   HGBUR NEGATIVE 09/30/2018 0058   BILIRUBINUR NEGATIVE 09/30/2018 0058   KETONESUR 5 (A) 09/30/2018 0058   PROTEINUR >=300 (A) 09/30/2018 0058   NITRITE NEGATIVE 09/30/2018 0058   LEUKOCYTESUR NEGATIVE 09/30/2018 0058   Sepsis Labs: @LABRCNTIP (procalcitonin:4,lacticidven:4)  ) Recent Results (from the past 240 hour(s))  Blood culture (routine x 2)     Status: None (Preliminary result)   Collection Time: 09/29/18  9:57 PM  Result Value Ref Range Status   Specimen Description   Final    BLOOD RIGHT ARM Performed at [facility], [place] [address] [place], [place] [address]    Special Requests   Final    BOTTLES DRAWN AEROBIC AND ANAEROBIC Blood Culture adequate volume Performed at [place] [address] [place], [place] [address]    Culture   Final    NO GROWTH 2 DAYS Performed at [place] [address] [place], [place] [address]    Report Status PENDING  Incomplete  Blood culture (routine x 2)     Status: None (Preliminary result)   Collection Time: 09/29/18 10:02 PM  Result Value Ref Range Status   Specimen Description   Final    BLOOD RIGHT HAND Performed at [place] [address] [place], [place] [address]    Special Requests   Final    BOTTLES DRAWN AEROBIC AND ANAEROBIC Blood Culture adequate volume Performed at [place] [address] [place], [place] [address]    Culture   Final    NO GROWTH 1 DAY Performed at [facility], [place] [address] [place], [place] [address]    Report Status PENDING  Incomplete  Novel Coronavirus, NAA (hospital order; send-out to ref lab)     Status: Abnormal   Collection Time: 09/30/18 12:31 AM  Result Value Ref Range Status   SARS-CoV-2, NAA DETECTED (A) NOT DETECTED Final    Comment: Positive (Detected) results are indicative of active infection with SARS CoV 2. A positive result does not rule out bacterial infection or coinfection with other viruses. Detection of SARS CoV 2 viral RNA may  not indicate that SARS CoV 2 is the causative agent for clinical symptoms. Positive and negative predictive values of testing are highly dependent on prevalence. False positive test results are more likely when prevalence is moderate to low. CRITICAL RESULT CALLED TO, READ BACK BY AND VERIFIED WITH: B YARDBOROUGH 10/02/18 0143 JDW (NOTE) The expected result is Negative (Not Detected). The SARS CoV 2 test is intended for the presumptive qualitative  detection of nucleic acid from SARS CoV 2 in upper and lower  respiratory specimens. Testing methodology is real time RT PCR. Test results must be correlated with clinical presentation and  evaluated in the context of other laboratory and epidemiologic data.  Test performance can be affected because the epidemiology and  clin ical spectrum of infection caused by SARS CoV 2 is not fully  known. For example, the optimum types of specimens to collect and  when during the course of infection these specimens are most likely  to contain detectable viral RNA may not be known. This test has not been Food and Drug Administration (FDA) cleared or   approved and has been authorized by FDA under an Emergency Use  Authorization (EUA). The test is only authorized for the duration of  the declaration that circumstances exist justifying the authorization  of emergency use of in vitro diagnostic tests for detection and or  diagnosis of SARS CoV 2 under Section 564(b)(1) of the Act, 21 U.S.C.  section [phone] 3(b)(1), unless the authorization is terminated or  revoked sooner. [facility] is certified under the  Clinical Laboratory Improvement Amendments of 1988 (CLIA), 42 U.S.C.  section [phone], to perform high complexity tests. Performed at [place] [id number], [place], [place] [address] Laboratory Director: Loleta Books, MD Performed at [facility], [place] [address], [place], [place] [address]    Coronavirus Source NASOPHARYNGEAL  Final    Comment: Performed at [facility], [place] [address] [place], [place] [address]  Urine culture     Status: None   Collection Time: 09/30/18 12:58 AM  Result Value Ref Range Status   Specimen Description   Final    URINE, RANDOM Performed at [place] [address], [place], [place] [address]    Special Requests   Final    NONE Performed at [facility], [place] [address] [place], [place] [address]    Culture   Final    NO GROWTH Performed at [facility], [place] [address], [place],  [address]    Report Status 10/01/2018 FINAL  Final         Radiology Studies: No results found.      Scheduled Meds: . allopurinol  100 mg Oral Daily  . aspirin EC  81 mg Oral Daily  . atorvastatin  5 mg Oral q1800  . DULoxetine  30 mg Oral Daily  . enoxaparin (LOVENOX) injection  40 mg Subcutaneous Daily  . fluticasone  1 puff Inhalation BID  . folic acid  1 mg Oral Daily  . gabapentin  400 mg Oral BID  . insulin aspart  0-15 Units Subcutaneous  TID WC  . loratadine  10 mg Oral Daily  . losartan  100 mg Oral QHS  . metoprolol succinate  50 mg Oral Daily  . montelukast  10 mg Oral QHS  . multivitamin with minerals  1 tablet Oral Daily  . omega-3 acid ethyl  esters  1 g Oral Daily  . pantoprazole  40 mg Oral BID  . vitamin E  100 Units Oral Daily   Continuous Infusions:   LOS: 3 days    Time spent: 67min    Domenic Polite, MD [facility]  10/02/2018, 10:49 AM

## 2018-10-02 NOTE — Care Management Important Message (Signed)
Important Message  Patient Details  Name: Megan Golden MRN: [id number] Date of Birth: Sep 21, 1945   Medicare Important Message Given:  Yes    Kerin Salen 10/02/2018, 11:30 AMImportant Message  Patient Details  Name: Megan Golden MRN: [id number] Date of Birth: 1945-11-11   Medicare Important Message Given:  Yes    Kerin Salen 10/02/2018, 11:30 AM

## 2018-10-02 NOTE — Progress Notes (Signed)
Microbiology lab called to report COVID POSITIVE result for the patient.   Lamar Blinks, NP notified by text page, Neil Crouch, RN, [facility] notified also.

## 2018-10-02 NOTE — Discharge Summary (Addendum)
Physician Discharge Summary  Megan Golden BMW:[id number] DOB: Nov 13, 1945 DOA: 09/29/2018  PCP: Reita Cliche, MD  Admit date: 09/29/2018 Discharge date: 10/02/2018  Time spent: 35 minutes  Recommendations for Outpatient Follow-up:  1. PCP in 2 weeks 2. Patient instructed to self isolate for 2 weeks given positive novel coronavirus 3. Instructed to resume low-dose Lasix and KCl in 1 week per home regimen and metformin   Discharge Diagnoses:  Principal Problem:   Acute respiratory disease due to COVID-19 virus Active Problems:   Essential hypertension   Type 2 diabetes mellitus without retinopathy ([place])   Rheumatoid arthritis ([place])   Discharge Condition: stable  Diet recommendation: Low-sodium, diabetic  Filed Weights   09/30/18 0127  Weight: 76.7 kg    History of present illness:  73 y.o.femalewith medical history significant ofDM2, HTN, RA on MTX. Presented to ED with fever, bodyaches, cough, weakness. Positive sick contact with [place], lived with her brother in law who just died from Covid  Patient has been feeling ill with cough, SOB, diarrhea, body aches, fever. Symptoms ongoing since around the 19th. Persistent / worsening over the past week. Saw PCP on Friday who was concerned for [place] [facility] Course:   Acute respiratory disease due to COVID-19 virus -clinically mets all criteria for Covid 10, fever, productive cough, bilateral infiltrates and positive sick contact -fortunately continues to be stable from a respiratory standpoint, with no O2 requirement -Sars Covid 19 test was positive -admitting M.D. discussed with infectious disease and patient was started on hydroxy chloroquine, she is allergic to azithromycin hence this was not ordered -unfortunately continues to have severe nausea some of which could be due to hydroxychloroquine, hence this was discontinued  -Overall improving afebrile, no hypoxia , oral intake continues to be poor , slowly  improving though, will be discharged home to follow-up with PCP in 2 weeks   Rheumatoid arthritis -Methotrexate on hold since last week due to acute illness/Infection -Advised not to restart methotrexate for at least couple of weeks  Hypertension -continued metoprolol, losartan -Remained dry/euvolemic, advised to hold off on restarting low-dose Lasix and KCL until next week when oral intake is much better  Type 2DM -metformin held, advised to resume this is one week  Discharge Exam: Vitals:   10/02/18 0439 10/02/18 1130  BP: (!) 142/65 140/65  Pulse: 68 74  Resp: 18 20  Temp: 98.5 F (36.9 C) 98.4 F (36.9 C)  SpO2: 96% 97%    General: AAOx3 Cardiovascular: S1S2/RRR Respiratory: CTAB  Discharge Instructions   Discharge Instructions    Diet - low sodium heart healthy   Complete by:  As directed    Discharge instructions   Complete by:  As directed    Please restart Lasix, metformin and Kcl in 1 week, when PO intake is better   Person Under Monitoring Name: Megan Golden  Location: [address] [place] [address]   Infection Prevention Recommendations for Individuals Confirmed to have, or Being Evaluated for, 2019 Novel Coronavirus (COVID-19) Infection Who Receive Care at Home  Individuals who are confirmed to have, or are being evaluated for, COVID-19 should follow the prevention steps below until a healthcare provider or local or state health department says they can return to normal activities.  Stay home except to get medical care You should restrict activities outside your home, except for getting medical care. Do not go to work, school, or public areas, and do not use public transportation or taxis.  Call  ahead before visiting your doctor Before your medical appointment, call the healthcare provider and tell them that you have, or are being evaluated for, COVID-19 infection. This will help the healthcare provider's office take steps to keep other  people from getting infected. Ask your healthcare provider to call the local or state health department.  Monitor your symptoms Seek prompt medical attention if your illness is worsening (e.g., difficulty breathing). Before going to your medical appointment, call the healthcare provider and tell them that you have, or are being evaluated for, COVID-19 infection. Ask your healthcare provider to call the local or state health department.  Wear a facemask You should wear a facemask that covers your nose and mouth when you are in the same room with other people and when you visit a healthcare provider. People who live with or visit you should also wear a facemask while they are in the same room with you.  Separate yourself from other people in your home As much as possible, you should stay in a different room from other people in your home. Also, you should use a separate bathroom, if available.  Avoid sharing household items You should not share dishes, drinking glasses, cups, eating utensils, towels, bedding, or other items with other people in your home. After using these items, you should wash them thoroughly with soap and water.  Cover your coughs and sneezes Cover your mouth and nose with a tissue when you cough or sneeze, or you can cough or sneeze into your sleeve. Throw used tissues in a lined trash can, and immediately wash your hands with soap and water for at least 20 seconds or use an alcohol-based hand rub.  Wash your [facility] your hands often and thoroughly with soap and water for at least 20 seconds. You can use an alcohol-based hand sanitizer if soap and water are not available and if your hands are not visibly dirty. Avoid touching your eyes, nose, and mouth with unwashed hands.   Prevention Steps for Caregivers and Household Members of Individuals Confirmed to have, or Being Evaluated for, COVID-19 Infection Being Cared for in the Home  If you live with, or provide  care at home for, a person confirmed to have, or being evaluated for, COVID-19 infection please follow these guidelines to prevent infection:  Follow healthcare provider's instructions Make sure that you understand and can help the patient follow any healthcare provider instructions for all care.  Provide for the patient's basic needs You should help the patient with basic needs in the home and provide support for getting groceries, prescriptions, and other personal needs.  Monitor the patient's symptoms If they are getting sicker, call his or her medical provider and tell them that the patient has, or is being evaluated for, COVID-19 infection. This will help the healthcare provider's office take steps to keep other people from getting infected. Ask the healthcare provider to call the local or state health department.  Limit the number of people who have contact with the patient If possible, have only one caregiver for the patient. Other household members should stay in another home or place of residence. If this is not possible, they should stay in another room, or be separated from the patient as much as possible. Use a separate bathroom, if available. Restrict visitors who do not have an essential need to be in the home.  Keep older adults, very young children, and other sick people away from the patient Keep older adults, very young  children, and those who have compromised immune systems or chronic health conditions away from the patient. This includes people with chronic heart, lung, or kidney conditions, diabetes, and cancer.  Ensure good ventilation Make sure that shared spaces in the home have good air flow, such as from an air conditioner or an opened window, weather permitting.  Wash your hands often Wash your hands often and thoroughly with soap and water for at least 20 seconds. You can use an alcohol based hand sanitizer if soap and water are not available and if your hands  are not visibly dirty. Avoid touching your eyes, nose, and mouth with unwashed hands. Use disposable paper towels to dry your hands. If not available, use dedicated cloth towels and replace them when they become wet.  Wear a facemask and gloves Wear a disposable facemask at all times in the room and gloves when you touch or have contact with the patient's blood, body fluids, and/or secretions or excretions, such as sweat, saliva, sputum, nasal mucus, vomit, urine, or feces.  Ensure the mask fits over your nose and mouth tightly, and do not touch it during use. Throw out disposable facemasks and gloves after using them. Do not reuse. Wash your hands immediately after removing your facemask and gloves. If your personal clothing becomes contaminated, carefully remove clothing and launder. Wash your hands after handling contaminated clothing. Place all used disposable facemasks, gloves, and other waste in a lined container before disposing them with other household waste. Remove gloves and wash your hands immediately after handling these items.  Do not share dishes, glasses, or other household items with the patient Avoid sharing household items. You should not share dishes, drinking glasses, cups, eating utensils, towels, bedding, or other items with a patient who is confirmed to have, or being evaluated for, COVID-19 infection. After the person uses these items, you should wash them thoroughly with soap and water.  Wash laundry thoroughly Immediately remove and wash clothes or bedding that have blood, body fluids, and/or secretions or excretions, such as sweat, saliva, sputum, nasal mucus, vomit, urine, or feces, on them. Wear gloves when handling laundry from the patient. Read and follow directions on labels of laundry or clothing items and detergent. In general, wash and dry with the warmest temperatures recommended on the label.  Clean all areas the individual has used often Clean all  touchable surfaces, such as counters, tabletops, doorknobs, bathroom fixtures, toilets, phones, keyboards, tablets, and bedside tables, every day. Also, clean any surfaces that may have blood, body fluids, and/or secretions or excretions on them. Wear gloves when cleaning surfaces the patient has come in contact with. Use a diluted bleach solution (e.g., dilute bleach with 1 part bleach and 10 parts water) or a household disinfectant with a label that says EPA-registered for coronaviruses. To make a bleach solution at home, add 1 tablespoon of bleach to 1 quart (4 cups) of water. For a larger supply, add  cup of bleach to 1 gallon (16 cups) of water. Read labels of cleaning products and follow recommendations provided on product labels. Labels contain instructions for safe and effective use of the cleaning product including precautions you should take when applying the product, such as wearing gloves or eye protection and making sure you have good ventilation during use of the product. Remove gloves and wash hands immediately after cleaning.  Monitor yourself for signs and symptoms of illness Caregivers and household members are considered close contacts, should monitor their health, and will be  asked to limit movement outside of the home to the extent possible. Follow the monitoring steps for close contacts listed on the symptom monitoring form.   ? If you have additional questions, contact your local health department or call the epidemiologist on call at [phone] (available 24/7). ? This guidance is subject to change. For the most up-to-date guidance from [facility], please refer to their website: [email or web address]  ?   Person Under Monitoring Name: BEYONCE SAWATZKY  Location: [address] [place] [address]   CORONAVIRUS DISEASE 2019 (COVID-19) Guidance for Persons Under Investigation You are being tested for the virus that  causes coronavirus disease 2019 (COVID-19). Public health actions are necessary to ensure protection of your health and the health of others, and to prevent further spread of infection. COVID-19 is caused by a virus that can cause symptoms, such as fever, cough, and shortness of breath. The primary transmission from person to person is by coughing or sneezing. On July 30, 2018, the [place] announced a [employer] Emergency of International Concern and on July 31, 2018 the U.S. Department of Health and Human Services declared a public health emergency. If the virus that causesCOVID-19 spreads in the community, it could have severe public health consequences.  As a person under investigation for COVID-19, the [place] advises you to adhere to the following guidance until your test results are reported to you. If your test result is positive, you will receive additional information from your provider and your local health department at that time.  Remain at home until you are cleared by your health provider or public health authorities.  Keep a log of visitors to your home using the form provided. Any visitors to your home must be aware of your isolation status. If you plan to move to a new address or leave the county, notify the local health department in your county. Call a doctor or seek care if you have an urgent medical need. Before seeking medical care, call ahead and get instructions from the provider before arriving at the medical office, clinic or hospital. Notify them that you are being tested for the virus that causes COVID-19 so arrangements can be made, as necessary, to prevent transmission to others in the healthcare setting. Next, notify the local health department in your county. If a medical emergency arises and you need to call 911, inform the first responders that you are being tested for the  virus that causes COVID-19. Next, notify the local health department in your county. Adhere to all guidance set forth by the [place] for [facility] of patients that is based on guidance from the Center for Disease Control and Prevention with suspected or confirmed COVID-19. It is provided with this guidance for Persons Under Investigation.  Your health and the health of our community are our top priorities. Public Health officials remain available to provide assistance and counseling to you about COVID-19 and compliance with this guidance.  Provider: ____________________________________________________________ Date: ______/_____/_________  By signing below, you acknowledge that you have read and agree to comply with this Guidance for Persons Under Investigation. ______________________________________________________________ Date: ______/_____/_________  WHO DO I CALL? You can find a list of local health departments here: [email or web address] Health Department: ____________________________________________________________________ Contact Name: ________________________________________________________________________ Telephone: ___________________________________________________________________________  Marice Potter, [place], Communicable Disease Branch COVID-19 Guidance for Persons Under Investigation September 05, 2018   Increase  activity slowly   Complete by:  As directed      Allergies as of 10/02/2018      Reactions   Azithromycin Hives, Rash   Ciprofloxacin Hives, Rash   Hydrocodone-acetaminophen Hives, Rash   Levofloxacin Hives, Rash      Medication List    STOP taking these medications   BEE POLLEN PO   Colchicine 0.6 MG Caps   doxycycline 100 MG capsule Commonly known as:  VIBRAMYCIN   methotrexate 2.5 MG tablet Commonly known as:  RHEUMATREX     TAKE these medications    albuterol 108 (90 Base) MCG/ACT inhaler Commonly known as:  PROVENTIL HFA;VENTOLIN HFA Inhale 1-2 puffs into the lungs every 4 (four) hours as needed for wheezing or shortness of breath.   allopurinol 100 MG tablet Commonly known as:  ZYLOPRIM Take 100 mg by mouth daily.   Arnuity Ellipta 100 MCG/ACT Aepb Generic drug:  Fluticasone Furoate Inhale 1 puff into the lungs daily.   aspirin EC 81 MG tablet Take 81 mg by mouth daily.   atorvastatin 10 MG tablet Commonly known as:  LIPITOR Take 5 mg by mouth daily.   b complex vitamins capsule Take 1 capsule by mouth daily.   CALCIUM MAGNESIUM PO Take 1 tablet by mouth daily. Plus [phone] mg-unit   DULoxetine 30 MG capsule Commonly known as:  CYMBALTA Take 30 mg by mouth daily.   fexofenadine 180 MG tablet Commonly known as:  ALLEGRA Take 180 mg by mouth daily.   Flaxseed Oil 1000 MG Caps Take 1 capsule by mouth daily.   fluticasone 50 MCG/ACT nasal spray Commonly known as:  FLONASE Place 1 spray into both nostrils daily as needed for allergies.   folic acid 1 MG tablet Commonly known as:  FOLVITE Take 1 mg by mouth daily.   furosemide 20 MG tablet Commonly known as:  LASIX Take 1 tablet (20 mg total) by mouth daily. Restart in  1 week Start taking on:  October 07, 2018 What changed:    when to take this  additional instructions  These instructions start on October 07, 2018. If you are unsure what to do until then, ask your doctor or other care provider.   gabapentin 400 MG capsule Commonly known as:  NEURONTIN Take 400 mg by mouth 2 (two) times daily.   glimepiride 1 MG tablet Commonly known as:  AMARYL Take 1 mg by mouth daily with breakfast.   losartan-hydrochlorothiazide 100-12.5 MG tablet Commonly known as:  HYZAAR Take 1 tablet by mouth at bedtime.   metFORMIN 500 MG 24 hr tablet Commonly known as:  GLUCOPHAGE-XR Take 1 tablet (500 mg total) by mouth 2 (two) times daily. Restart in 1 week Start  taking on:  October 07, 2018 What changed:    additional instructions  These instructions start on October 07, 2018. If you are unsure what to do until then, ask your doctor or other care provider.   metoprolol succinate 50 MG 24 hr tablet Commonly known as:  TOPROL-XL Take 50 mg by mouth daily.   montelukast 10 MG tablet Commonly known as:  SINGULAIR Take 10 mg by mouth at bedtime.   multivitamin capsule Take 1 capsule by mouth daily.   NONFORMULARY OR COMPOUNDED ITEM Apply 1-2 g topically 4 (four) times daily. [facility]  Peripheral Neuropathy Cream- Bupivacaine 1%, Doxepin 3%, Gabapentin 6%, Pentoxifylline 3%, Topiramate 1% Apply 1-2 grams to affected area 3-4 times daily Qty. 120 gm 3 refills   [place]  Nasal Spray 0.65 % nasal spray Generic drug:  sodium chloride Place 1 spray into the nose as needed for congestion.   omega-3 acid ethyl esters 1 g capsule Commonly known as:  LOVAZA Take 1 g by mouth daily.   omeprazole 20 MG capsule Commonly known as:  PRILOSEC Take 40 mg by mouth 2 (two) times daily.   OneTouch Verio test strip Generic drug:  glucose blood USE TO CHECK BLOOD SUGAR EVERY DAY   potassium chloride 10 MEQ tablet Commonly known as:  K-DUR,KLOR-CON Take 1 tablet (10 mEq total) by mouth 2 (two) times daily. Restart in 1 week with lasix Start taking on:  October 07, 2018 What changed:    additional instructions  These instructions start on October 07, 2018. If you are unsure what to do until then, ask your doctor or other care provider.   promethazine-dextromethorphan 6.25-15 MG/5ML syrup Commonly known as:  PROMETHAZINE-DM Take 5 mLs by mouth every 6 (six) hours as needed.   VITAMIN B-12 PO Take 1 tablet by mouth daily.   VITAMIN C PO Take 1 tablet by mouth daily.   vitamin E 100 UNIT capsule Take 100 Units by mouth daily.      Allergies  Allergen Reactions  . Azithromycin Hives and Rash  . Ciprofloxacin Hives and Rash  .  Hydrocodone-Acetaminophen Hives and Rash  . Levofloxacin Hives and Rash      The results of significant diagnostics from this hospitalization (including imaging, microbiology, ancillary and laboratory) are listed below for reference.    Significant Diagnostic Studies: Dg Chest Portable 1 View  Result Date: 09/29/2018 CLINICAL DATA:  Cough, fever.  Possible Covid exposure EXAM: PORTABLE CHEST 1 VIEW COMPARISON:  09/25/2018 FINDINGS: Normal heart size. Aortic atherosclerosis. Bilateral patchy airspace densities are again identified within the right upper lobe and left midlung. These appear increased when compared with previous exam. IMPRESSION: Progressive bilateral airspace opacities compared with 09/25/2018 compatible with multifocal pneumonia. Electronically Signed   By: Kerby Moors M.D.   On: 09/29/2018 22:34    Microbiology: Recent Results (from the past 240 hour(s))  Blood culture (routine x 2)     Status: None (Preliminary result)   Collection Time: 09/29/18  9:57 PM  Result Value Ref Range Status   Specimen Description   Final    BLOOD RIGHT ARM Performed at [facility], [place] [address], [place], [place] [address]    Special Requests   Final    BOTTLES DRAWN AEROBIC AND ANAEROBIC Blood Culture adequate volume Performed at [facility] [address], [place], [place] [address]    Culture   Final    NO GROWTH 3 DAYS Performed at [facility], [place] [address] [place] [address], [place], [place] [address]    Report Status PENDING  Incomplete  Blood culture (routine x 2)     Status: None (Preliminary result)   Collection Time: 09/29/18 10:02 PM  Result Value Ref Range Status   Specimen Description   Final    BLOOD RIGHT HAND Performed at [facility] [address], [place], [place] [address]    Special Requests   Final    BOTTLES DRAWN AEROBIC AND ANAEROBIC Blood Culture adequate volume Performed at [facility] [address] [place], [place] [address]    Culture   Final    NO GROWTH 2 DAYS Performed at [facility] [address], [place], [place] [address]    Report  Status PENDING  Incomplete  Novel Coronavirus, NAA (hospital order; send-out to ref lab)     Status: Abnormal   Collection Time: 09/30/18 12:31 AM  Result Value Ref Range Status   SARS-CoV-2, NAA DETECTED (A) NOT DETECTED Final    Comment: Positive (Detected) results are indicative of active infection with SARS CoV 2. A positive result does not rule out bacterial infection or coinfection with other viruses. Detection of SARS CoV 2 viral RNA may not indicate that SARS CoV 2 is the causative agent for clinical symptoms. Positive and negative predictive values of testing are highly dependent on prevalence. False positive test results are more likely when prevalence is moderate to low. CRITICAL RESULT CALLED TO, READ BACK BY AND VERIFIED WITH: B YARDBOROUGH 10/02/18 0143 JDW (NOTE) The expected result is Negative (Not Detected). The SARS CoV 2 test is intended for the presumptive qualitative  detection of nucleic acid from SARS CoV 2 in upper and lower  respiratory specimens. Testing methodology is real time RT PCR. Test results must be correlated with clinical presentation and  evaluated in the context of other laboratory and epidemiologic data.  Test performance can be affected because the epidemiology and  clin ical spectrum of infection caused by SARS CoV 2 is not fully  known. For example, the optimum types of specimens to collect and  when during the course of infection these specimens are most likely  to contain detectable viral RNA may not be known. This test has not been Food and Drug Administration (FDA) cleared or  approved and has been authorized by FDA under an Emergency Use  Authorization (EUA). The test is only authorized for the duration of  the declaration that circumstances exist justifying the  authorization  of emergency use of in vitro diagnostic tests for detection and or  diagnosis of SARS CoV 2 under Section 564(b)(1) of the Act, 21 U.S.C.  section 4454850953 3(b)(1), unless the authorization is terminated or  revoked sooner. [facility] is certified under the  Clinical Laboratory Improvement Amendments of 1988 (CLIA), 42 U.S.C.  section [phone], to perform high complexity tests. Performed at [facility] [id number] [address], [place], [place] [address] Laboratory Director: Loleta Books, MD Performed at [facility], [place] [address] [place], [place] [address]    Coronavirus Source NASOPHARYNGEAL  Final    Comment: Performed at [facility], [place] [address], [place], [place] [address]  Urine culture     Status: None   Collection Time: 09/30/18 12:58 AM  Result Value Ref Range Status   Specimen Description   Final    URINE, RANDOM Performed at [facility] [address], [place], [place] [address]    Special Requests   Final    NONE Performed at [facility], [place] [address] [place], [place] [address]    Culture   Final    NO GROWTH Performed at [facility], [place] [address], [place], [place] [address]    Report Status 10/01/2018 FINAL  Final     Labs: Basic Metabolic Panel: Recent Labs  Lab 09/29/18 2215 09/30/18 0419 10/01/18 0403 10/02/18 0344  NA 133* 133* 133* 137  K 4.0 3.8 3.0* 3.7  CL 98 102 100 106  CO2 22 22 22 23   GLUCOSE 160* 129* 112* 122*  BUN 16 14 12 12   CREATININE 0.98 0.94 0.84 0.81  CALCIUM 7.7* 8.4*  8.1* 8.2*   Liver Function Tests: Recent Labs  Lab 09/29/18 2215 09/30/18 0419 10/01/18 0403 10/02/18 0344  AST 52* 41 52* 48*  ALT 33 27 36 40  ALKPHOS 51 45 49 63  BILITOT 0.8 0.9 0.8 0.8  PROT 7.5 6.3* 6.2* 6.4*  ALBUMIN 3.1* 2.7* 2.4* 2.4*   No results for input(s): LIPASE, AMYLASE in the last  168 hours. No results for input(s): AMMONIA in the last 168 hours. CBC: Recent Labs  Lab 09/29/18 2215 09/30/18 0419 10/01/18 0403 10/02/18 0344  WBC 8.1 7.8 6.5 6.4  NEUTROABS 7.1 6.2 4.6 5.0  HGB 11.6* 10.4* 10.5* 10.4*  HCT 34.5* 30.2* 32.0* 30.3*  MCV 100.0 99.0 101.3* 100.0  PLT 299 238 290 367   Cardiac Enzymes: No results for input(s): CKTOTAL, CKMB, CKMBINDEX, TROPONINI in the last 168 hours. BNP: BNP (last 3 results) No results for input(s): BNP in the last 8760 hours.  ProBNP (last 3 results) No results for input(s): PROBNP in the last 8760 hours.  CBG: Recent Labs  Lab 10/01/18 1236 10/01/18 1634 10/01/18 2118 10/02/18 0732 10/02/18 1124  GLUCAP 113* 104* 114* 118* 146*       Signed:  Domenic Polite MD.  [facility] 10/02/2018, 2:16 PM

## 2018-10-02 NOTE — Plan of Care (Signed)
Plan of care reviewed and discussed with the patient. 

## 2018-10-04 LAB — CULTURE, BLOOD (ROUTINE X 2)
CULTURE: NO GROWTH
Special Requests: ADEQUATE

## 2018-10-05 LAB — CULTURE, BLOOD (ROUTINE X 2)
CULTURE: NO GROWTH
Special Requests: ADEQUATE

## 2018-10-07 DIAGNOSIS — I5032 Chronic diastolic (congestive) heart failure: Secondary | ICD-10-CM | POA: Insufficient documentation

## 2018-10-07 DIAGNOSIS — E781 Pure hyperglyceridemia: Secondary | ICD-10-CM | POA: Insufficient documentation

## 2018-10-07 DIAGNOSIS — I700 Atherosclerosis of aorta: Secondary | ICD-10-CM | POA: Insufficient documentation

## 2018-10-07 DIAGNOSIS — D849 Immunodeficiency, unspecified: Secondary | ICD-10-CM | POA: Insufficient documentation

## 2018-10-07 DIAGNOSIS — Z8616 Personal history of COVID-19: Secondary | ICD-10-CM | POA: Insufficient documentation

## 2018-12-11 ENCOUNTER — Ambulatory Visit: Payer: Medicare Other | Admitting: Podiatry

## 2018-12-18 ENCOUNTER — Other Ambulatory Visit: Payer: Self-pay

## 2018-12-18 ENCOUNTER — Encounter: Payer: Self-pay | Admitting: Podiatry

## 2018-12-18 ENCOUNTER — Ambulatory Visit: Payer: Medicare Other | Admitting: Podiatry

## 2018-12-18 VITALS — Temp 97.1°F

## 2018-12-18 DIAGNOSIS — B351 Tinea unguium: Secondary | ICD-10-CM

## 2018-12-18 DIAGNOSIS — M79674 Pain in right toe(s): Secondary | ICD-10-CM

## 2018-12-18 DIAGNOSIS — M79675 Pain in left toe(s): Secondary | ICD-10-CM | POA: Diagnosis not present

## 2018-12-18 NOTE — Patient Instructions (Signed)

## 2018-12-29 NOTE — Progress Notes (Signed)
Subjective: [name] presents today with painful, thick toenails 1-5 b/l that she cannot cut and which interfere with daily activities.  Pain is aggravated when wearing enclosed shoe gear.  Patient states she had COVID-19 and has recovered from it. She was hospitalized March 30th for 4 days. She then quarantined at home until April 17th. States she feels much better. Her symptoms were more GI related.   Reita Cliche, MD is her PCP.    Current Outpatient Medications:  .  Omega-3 1000 MG CAPS, Take by mouth., Disp: , Rfl:  .  albuterol (PROVENTIL HFA;VENTOLIN HFA) 108 (90 Base) MCG/ACT inhaler, Inhale 1-2 puffs into the lungs every 4 (four) hours as needed for wheezing or shortness of breath. , Disp: , Rfl:  .  allopurinol (ZYLOPRIM) 100 MG tablet, Take 100 mg by mouth daily. , Disp: , Rfl:  .  Ascorbic Acid (VITAMIN C PO), Take 1 tablet by mouth daily. , Disp: , Rfl:  .  aspirin EC 81 MG tablet, Take 81 mg by mouth daily., Disp: , Rfl:  .  atorvastatin (LIPITOR) 10 MG tablet, Take 5 mg by mouth daily., Disp: , Rfl: 11 .  b complex vitamins capsule, Take 1 capsule by mouth daily., Disp: , Rfl:  .  Calcium-Magnesium-Vitamin D (CALCIUM MAGNESIUM PO), Take 1 tablet by mouth daily. Plus [phone] mg-unit , Disp: , Rfl:  .  Cyanocobalamin (VITAMIN B-12 PO), Take 1 tablet by mouth daily. , Disp: , Rfl:  .  DULoxetine (CYMBALTA) 30 MG capsule, Take 30 mg by mouth daily., Disp: , Rfl:  .  fexofenadine (ALLEGRA) 180 MG tablet, Take 180 mg by mouth daily. , Disp: , Rfl:  .  Flaxseed, Linseed, (FLAXSEED OIL) 1000 MG CAPS, Take 1 capsule by mouth daily. , Disp: , Rfl:  .  fluticasone (FLONASE) 50 MCG/ACT nasal spray, Place 1 spray into both nostrils daily as needed for allergies. , Disp: , Rfl:  .  Fluticasone Furoate (ARNUITY ELLIPTA) 100 MCG/ACT AEPB, Inhale 1 puff into the lungs daily. , Disp: , Rfl:  .  folic acid (FOLVITE) 1 MG tablet, Take 1 mg by mouth daily., Disp: , Rfl: 0 .   furosemide (LASIX) 20 MG tablet, Take 1 tablet (20 mg total) by mouth daily. Restart in  1 week, Disp: , Rfl:  .  gabapentin (NEURONTIN) 400 MG capsule, Take 400 mg by mouth 2 (two) times daily. , Disp: , Rfl:  .  glimepiride (AMARYL) 1 MG tablet, Take 1 mg by mouth daily with breakfast. , Disp: , Rfl:  .  glucose blood (ONETOUCH VERIO) test strip, USE TO CHECK BLOOD SUGAR EVERY DAY, Disp: , Rfl:  .  losartan-hydrochlorothiazide (HYZAAR) 100-12.5 MG tablet, Take 1 tablet by mouth at bedtime., Disp: , Rfl: 2 .  metFORMIN (GLUCOPHAGE-XR) 500 MG 24 hr tablet, Take 1 tablet (500 mg total) by mouth 2 (two) times daily. Restart in 1 week, Disp: , Rfl:  .  methotrexate (RHEUMATREX) 2.5 MG tablet, TAKE 6 TABLETS BY MOUTH ONCE WEEKLY, Disp: , Rfl:  .  metoprolol succinate (TOPROL-XL) 50 MG 24 hr tablet, Take 50 mg by mouth daily. , Disp: , Rfl:  .  montelukast (SINGULAIR) 10 MG tablet, Take 10 mg by mouth at bedtime. , Disp: , Rfl:  .  Multiple Vitamin (MULTIVITAMIN) capsule, Take 1 capsule by mouth daily. , Disp: , Rfl:  .  NONFORMULARY OR COMPOUNDED ITEM, Apply 1-2 g topically 4 (four) times daily. [facility]  Peripheral Neuropathy  Cream- Bupivacaine 1%, Doxepin 3%, Gabapentin 6%, Pentoxifylline 3%, Topiramate 1% Apply 1-2 grams to affected area 3-4 times daily Qty. 120 gm 3 refills, Disp: 120 each, Rfl: 2 .  omega-3 acid ethyl esters (LOVAZA) 1 g capsule, Take 1 g by mouth daily., Disp: , Rfl:  .  omeprazole (PRILOSEC) 20 MG capsule, Take 40 mg by mouth 2 (two) times daily., Disp: , Rfl: 5 .  potassium chloride (K-DUR) 10 MEQ tablet, Take 10 mEq by mouth 2 (two) times daily., Disp: , Rfl:  .  potassium chloride (K-DUR,KLOR-CON) 10 MEQ tablet, Take 1 tablet (10 mEq total) by mouth 2 (two) times daily. Restart in 1 week with lasix, Disp: , Rfl:  .  predniSONE (DELTASONE) 5 MG tablet, Take 6 tabs before breakfast for 3days then 5 daily for 3days, 4daily for 3days, 3daily for 3days, 2daily for 3days then  1daily until finish, Disp: , Rfl:  .  promethazine-dextromethorphan (PROMETHAZINE-DM) 6.25-15 MG/5ML syrup, Take 5 mLs by mouth every 6 (six) hours as needed., Disp: , Rfl:  .  sodium chloride (OCEAN NASAL SPRAY) 0.65 % nasal spray, Place 1 spray into the nose as needed for congestion., Disp: , Rfl:  .  vitamin E 100 UNIT capsule, Take 100 Units by mouth daily., Disp: , Rfl:   Allergies  Allergen Reactions  . Azithromycin Hives and Rash  . Ciprofloxacin Hives and Rash  . Hydrocodone-Acetaminophen Hives and Rash  . Levofloxacin Hives and Rash    Objective: Vitals:   12/18/18 1558  Temp: (!) 97.1 F (36.2 C)    Vascular Examination: Capillary refill time immediate x 10 digits  Dorsalis pedis and Posterior tibial pulses palpable b/l  Digital hair diminished b/l.  Skin temperature gradient WNL b/l  Dermatological Examination: Skin with normal turgor, texture and tone b/l  Toenails 1-5 b/l discolored, thick, dystrophic with subungual debris and pain with palpation to nailbeds due to thickness of nails.  Musculoskeletal: Muscle strength 5/5 to all LE muscle groups  HAV right>left.  Hammertoes 2-5 b/l.  No pain, crepitus or joint limitation noted with ROM.   Neurological: Sensation intact with 10 gram monofilament.  Assessment: Painful onychomycosis toenails 1-5 b/l   Plan: 1. Toenails 1-5 b/l were debrided in length and girth without iatrogenic bleeding. 2. Patient to continue soft, supportive shoe gear daily. 3. Patient to report any pedal injuries to medical professional immediately. 4. Follow up 3 months.  5. Patient/POA to call should there be a concern in the interim.

## 2019-01-14 ENCOUNTER — Other Ambulatory Visit: Payer: Self-pay

## 2019-01-14 ENCOUNTER — Ambulatory Visit (INDEPENDENT_AMBULATORY_CARE_PROVIDER_SITE_OTHER): Payer: Medicare Other | Admitting: Podiatry

## 2019-01-14 VITALS — Temp 98.0°F

## 2019-01-14 DIAGNOSIS — L600 Ingrowing nail: Secondary | ICD-10-CM

## 2019-01-14 DIAGNOSIS — M79676 Pain in unspecified toe(s): Secondary | ICD-10-CM

## 2019-01-14 MED ORDER — CEPHALEXIN 500 MG PO CAPS: 500.0000 mg | ORAL_CAPSULE | Freq: Two times a day (BID) | ORAL | 0 refills | Status: DC

## 2019-01-14 NOTE — Patient Instructions (Signed)

## 2019-01-29 ENCOUNTER — Ambulatory Visit (INDEPENDENT_AMBULATORY_CARE_PROVIDER_SITE_OTHER): Payer: Medicare Other | Admitting: Podiatry

## 2019-01-29 ENCOUNTER — Other Ambulatory Visit: Payer: Self-pay

## 2019-01-29 DIAGNOSIS — L600 Ingrowing nail: Secondary | ICD-10-CM | POA: Diagnosis not present

## 2019-01-29 DIAGNOSIS — M79676 Pain in unspecified toe(s): Secondary | ICD-10-CM | POA: Diagnosis not present

## 2019-01-29 NOTE — Progress Notes (Signed)
  Subjective:  Patient ID: Megan Golden, female    DOB: May 12, 1946,  MRN: [id number]  Chief Complaint  Patient presents with  . Ingrown Toenail    nail check   73 y.o. female returns for the above complaint. States the nail is only slightly tender but doesn't hurt. Does have occasional redness from time to time but is not constant.  Objective:   General AA&O x3. Normal mood and affect.  Vascular Foot warm and well perfused with good capillary refill.  Neurologic Sensation grossly intact.  Dermatologic Nail avulsion site healing well without drainage or erythema. Nail bed with overlying soft crust. No residual ingrown nail evident. Slight lateral nail border blistering noted.  Orthopedic: No tenderness to palpation of the toe.   Assessment & Plan:  Patient was evaluated and treated and all questions answered.  S/p Ingrown Toenail Excision, left -Healing well no continued ingrown nail no warmth or cellulitis. No infection today no need for abx. -Continue soaking x2 weeks. -Advised to call should redness or drainage occur or worsen.

## 2019-02-08 DIAGNOSIS — H2511 Age-related nuclear cataract, right eye: Secondary | ICD-10-CM | POA: Insufficient documentation

## 2019-02-11 DIAGNOSIS — R197 Diarrhea, unspecified: Secondary | ICD-10-CM | POA: Insufficient documentation

## 2019-02-11 DIAGNOSIS — R0989 Other specified symptoms and signs involving the circulatory and respiratory systems: Secondary | ICD-10-CM | POA: Insufficient documentation

## 2019-02-18 ENCOUNTER — Other Ambulatory Visit: Payer: Self-pay

## 2019-02-18 ENCOUNTER — Ambulatory Visit (INDEPENDENT_AMBULATORY_CARE_PROVIDER_SITE_OTHER): Payer: Medicare Other | Admitting: Podiatry

## 2019-02-18 VITALS — Temp 97.7°F

## 2019-02-18 DIAGNOSIS — L600 Ingrowing nail: Secondary | ICD-10-CM | POA: Diagnosis not present

## 2019-03-14 NOTE — Progress Notes (Signed)
  Subjective:  Patient ID: Megan Golden, female    DOB: 04/23/1946,  MRN: [id number]  Chief Complaint  Patient presents with  . Nail Problem    Left 1st medial nail ingrown follow up. Pt states occasional shooting pains at rest and believes it's possible some nail remains. Pt also states this left 1st nail feels loose.   73 y.o. female returns for the above complaint.   Objective:   General AA&O x3. Normal mood and affect.  Vascular Foot warm and well perfused with good capillary refill.  Neurologic Sensation grossly intact.  Dermatologic Nail avulsion site healing well without drainage or erythema. No signs of local infection.  Orthopedic: No tenderness to palpation of the toe.   Assessment & Plan:  Patient was evaluated and treated and all questions answered.  S/p Ingrown Toenail Excision, left -Healing well without issue. -Discussed return precautions. -F/u PRN

## 2019-03-23 ENCOUNTER — Ambulatory Visit (INDEPENDENT_AMBULATORY_CARE_PROVIDER_SITE_OTHER): Payer: Medicare Other | Admitting: Podiatry

## 2019-03-23 ENCOUNTER — Other Ambulatory Visit: Payer: Self-pay

## 2019-03-23 ENCOUNTER — Encounter: Payer: Self-pay | Admitting: Podiatry

## 2019-03-23 DIAGNOSIS — M2011 Hallux valgus (acquired), right foot: Secondary | ICD-10-CM

## 2019-03-23 DIAGNOSIS — E1140 Type 2 diabetes mellitus with diabetic neuropathy, unspecified: Secondary | ICD-10-CM | POA: Diagnosis not present

## 2019-03-23 DIAGNOSIS — M79674 Pain in right toe(s): Secondary | ICD-10-CM | POA: Diagnosis not present

## 2019-03-23 DIAGNOSIS — E119 Type 2 diabetes mellitus without complications: Secondary | ICD-10-CM

## 2019-03-23 DIAGNOSIS — B351 Tinea unguium: Secondary | ICD-10-CM

## 2019-03-23 DIAGNOSIS — M79675 Pain in left toe(s): Secondary | ICD-10-CM

## 2019-03-23 DIAGNOSIS — M2041 Other hammer toe(s) (acquired), right foot: Secondary | ICD-10-CM

## 2019-03-23 DIAGNOSIS — M2012 Hallux valgus (acquired), left foot: Secondary | ICD-10-CM

## 2019-03-23 NOTE — Patient Instructions (Signed)

## 2019-03-28 NOTE — Progress Notes (Signed)
Subjective: Megan Golden is seen today for follow up preventative diabetic foot care with  painful, elongated, thickened toenails 1-5 b/l feet that she cannot cut. Pain interferes with daily activities. Aggravating factor includes wearing enclosed shoe gear and relieved with periodic debridement.  She voices no new pedal concerns on today's visit.  Current Outpatient Medications on File Prior to Visit  Medication Sig  . ketorolac (ACULAR) 0.5 % ophthalmic solution Place 1 drop into the right eye 4 times daily. Start after you arrive home from surgery.  . moxifloxacin (VIGAMOX) 0.5 % ophthalmic solution Place 1 drop into the right eye 4 times daily. Start after you arrive home from surgery.  . prednisoLONE acetate (PRED FORTE) 1 % ophthalmic suspension Place 1 drop into the right eye 4 times daily. Start after you arrive home from surgery.  Marland Kitchen albuterol (PROVENTIL HFA;VENTOLIN HFA) 108 (90 Base) MCG/ACT inhaler Inhale 1-2 puffs into the lungs every 4 (four) hours as needed for wheezing or shortness of breath.   . allopurinol (ZYLOPRIM) 100 MG tablet Take 100 mg by mouth daily.   . Ascorbic Acid (VITAMIN C PO) Take 1 tablet by mouth daily.   Marland Kitchen aspirin EC 81 MG tablet Take 81 mg by mouth daily.  Marland Kitchen atorvastatin (LIPITOR) 10 MG tablet Take 5 mg by mouth daily.  Marland Kitchen b complex vitamins capsule Take 1 capsule by mouth daily.  . Calcium-Magnesium-Vitamin D (CALCIUM MAGNESIUM PO) Take 1 tablet by mouth daily. Plus [phone] mg-unit   . cephALEXin (KEFLEX) 500 MG capsule Take 1 capsule (500 mg total) by mouth 2 (two) times daily.  . Cyanocobalamin (VITAMIN B-12 PO) Take 1 tablet by mouth daily.   . DULoxetine (CYMBALTA) 30 MG capsule Take 30 mg by mouth daily.  . DULoxetine (CYMBALTA) 60 MG capsule Take 60 mg by mouth daily.  . fexofenadine (ALLEGRA) 180 MG tablet Take 180 mg by mouth daily.   . Flaxseed, Linseed, (FLAXSEED OIL) 1000 MG CAPS Take 1 capsule by mouth daily.   . fluticasone (FLONASE) 50  MCG/ACT nasal spray Place 1 spray into both nostrils daily as needed for allergies.   . fluticasone (FLOVENT DISKUS) 50 MCG/BLIST diskus inhaler Inhale into the lungs.  . Fluticasone Furoate (ARNUITY ELLIPTA) 100 MCG/ACT AEPB Inhale 1 puff into the lungs daily.   . folic acid (FOLVITE) 1 MG tablet Take 1 mg by mouth daily.  . furosemide (LASIX) 20 MG tablet Take 1 tablet (20 mg total) by mouth daily. Restart in  1 week  . gabapentin (NEURONTIN) 400 MG capsule Take 400 mg by mouth 2 (two) times daily.   Marland Kitchen glimepiride (AMARYL) 1 MG tablet Take 1 mg by mouth daily with breakfast.   . glucose blood (ONETOUCH VERIO) test strip USE TO CHECK BLOOD SUGAR EVERY DAY  . losartan-hydrochlorothiazide (HYZAAR) 100-12.5 MG tablet Take 1 tablet by mouth at bedtime.  . metFORMIN (GLUCOPHAGE-XR) 500 MG 24 hr tablet Take 1 tablet (500 mg total) by mouth 2 (two) times daily. Restart in 1 week  . methotrexate (RHEUMATREX) 2.5 MG tablet TAKE 6 TABLETS BY MOUTH ONCE WEEKLY  . metoprolol succinate (TOPROL-XL) 50 MG 24 hr tablet Take 50 mg by mouth daily.   . montelukast (SINGULAIR) 10 MG tablet Take 10 mg by mouth at bedtime.   . Multiple Vitamin (MULTIVITAMIN) capsule Take 1 capsule by mouth daily.   . NONFORMULARY OR COMPOUNDED ITEM Apply 1-2 g topically 4 (four) times daily. [facility]  Peripheral Neuropathy Cream- Bupivacaine 1%, Doxepin 3%,  Gabapentin 6%, Pentoxifylline 3%, Topiramate 1% Apply 1-2 grams to affected area 3-4 times daily Qty. 120 gm 3 refills  . Omega-3 1000 MG CAPS Take by mouth.  . omega-3 acid ethyl esters (LOVAZA) 1 g capsule Take 1 g by mouth daily.  Marland Kitchen omeprazole (PRILOSEC) 20 MG capsule Take 40 mg by mouth 2 (two) times daily.  . potassium chloride (K-DUR) 10 MEQ tablet Take 10 mEq by mouth 2 (two) times daily.  . potassium chloride (K-DUR,KLOR-CON) 10 MEQ tablet Take 1 tablet (10 mEq total) by mouth 2 (two) times daily. Restart in 1 week with lasix  . predniSONE (DELTASONE) 5 MG  tablet Take 6 tabs before breakfast for 3days then 5 daily for 3days, 4daily for 3days, 3daily for 3days, 2daily for 3days then 1daily until finish  . promethazine-dextromethorphan (PROMETHAZINE-DM) 6.25-15 MG/5ML syrup Take 5 mLs by mouth every 6 (six) hours as needed.  . sodium chloride (OCEAN NASAL SPRAY) 0.65 % nasal spray Place 1 spray into the nose as needed for congestion.  . vitamin E 100 UNIT capsule Take 100 Units by mouth daily.   No current facility-administered medications on file prior to visit.      Allergies  Allergen Reactions  . Azithromycin Hives and Rash  . Ciprofloxacin Hives and Rash  . Hydrocodone-Acetaminophen Hives and Rash  . Levofloxacin Hives and Rash   Objective:  Vascular Examination: Capillary refill time immediate x 10 digits.  Dorsalis pedis and Posterior tibial pulses 2/4 b/l.  Digital hair decreased x 10 digits.  Skin temperature gradient WNL b/l.   Dermatological Examination: Skin with normal turgor, texture and tone b/l  Toenails 1, 3-5 b/l discolored, thick, dystrophic with subungual debris and pain with palpation to nailbeds due to thickness of nails.  Anonychia b/l 2nd toes with evidence of permanent total nail avulsion. Nailbeds completely epithelialized and intact.  Musculoskeletal: Muscle strength 5/5 to all LE muscle groups b/l.  Hammertoes 2-5 b/l  HAV with bunion right >left.  No pain, crepitus or joint limitation noted with ROM.   Neurological Examination: Protective sensation decreased b/l with 10 gram monofilament.  Vibratory sensation intact bilaterally.   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 Encounter for diabetic foot examination performed today.  2. Toenails 1, 3-5 b/l were debrided in length and girth without iatrogenic bleeding. 3. Patient to continue soft, supportive shoe  gear 4. Patient to report any pedal injuries to medical professional immediately. 5. Follow up 3 months.  6. Patient/POA to call should there be a concern in the interim.

## 2019-06-22 ENCOUNTER — Other Ambulatory Visit: Payer: Self-pay

## 2019-06-22 ENCOUNTER — Encounter: Payer: Self-pay | Admitting: Podiatry

## 2019-06-22 ENCOUNTER — Ambulatory Visit: Payer: Medicare Other | Admitting: Podiatry

## 2019-06-22 DIAGNOSIS — B351 Tinea unguium: Secondary | ICD-10-CM

## 2019-06-22 DIAGNOSIS — M79674 Pain in right toe(s): Secondary | ICD-10-CM | POA: Diagnosis not present

## 2019-06-22 DIAGNOSIS — E1140 Type 2 diabetes mellitus with diabetic neuropathy, unspecified: Secondary | ICD-10-CM

## 2019-06-22 DIAGNOSIS — M79675 Pain in left toe(s): Secondary | ICD-10-CM | POA: Diagnosis not present

## 2019-06-22 NOTE — Patient Instructions (Signed)
Diabetes Mellitus and Foot Care Foot care is an important part of your health, especially when you have diabetes. Diabetes may cause you to have problems because of poor blood flow (circulation) to your feet and legs, which can cause your skin to:  Become thinner and drier.  Break more easily.  Heal more slowly.  Peel and crack. You may also have nerve damage (neuropathy) in your legs and feet, causing decreased feeling in them. This means that you may not notice minor injuries to your feet that could lead to more serious problems. Noticing and addressing any potential problems early is the best way to prevent future foot problems. How to care for your feet Foot hygiene  Wash your feet daily with warm water and mild soap. Do not use hot water. Then, pat your feet and the areas between your toes until they are completely dry. Do not soak your feet as this can dry your skin.  Trim your toenails straight across. Do not dig under them or around the cuticle. File the edges of your nails with an emery board or nail file.  Apply a moisturizing lotion or petroleum jelly to the skin on your feet and to dry, brittle toenails. Use lotion that does not contain alcohol and is unscented. Do not apply lotion between your toes. Shoes and socks  Wear clean socks or stockings every day. Make sure they are not too tight. Do not wear knee-high stockings since they may decrease blood flow to your legs.  Wear shoes that fit properly and have enough cushioning. Always look in your shoes before you put them on to be sure there are no objects inside.  To break in new shoes, wear them for just a few hours a day. This prevents injuries on your feet. Wounds, scrapes, corns, and calluses  Check your feet daily for blisters, cuts, bruises, sores, and redness. If you cannot see the bottom of your feet, use a mirror or ask someone for help.  Do not cut corns or calluses or try to remove them with medicine.  If you  find a minor scrape, cut, or break in the skin on your feet, keep it and the skin around it clean and dry. You may clean these areas with mild soap and water. Do not clean the area with peroxide, alcohol, or iodine.  If you have a wound, scrape, corn, or callus on your foot, look at it several times a day to make sure it is healing and not infected. Check for: ? Redness, swelling, or pain. ? Fluid or blood. ? Warmth. ? Pus or a bad smell. General instructions  Do not cross your legs. This may decrease blood flow to your feet.  Do not use heating pads or hot water bottles on your feet. They may burn your skin. If you have lost feeling in your feet or legs, you may not know this is happening until it is too late.  Protect your feet from hot and cold by wearing shoes, such as at the beach or on hot pavement.  Schedule a complete foot exam at least once a year (annually) or more often if you have foot problems. If you have foot problems, report any cuts, sores, or bruises to your health care provider immediately. Contact a health care provider if:  You have a medical condition that increases your risk of infection and you have any cuts, sores, or bruises on your feet.  You have an injury that is not   healing.  You have redness on your legs or feet.  You feel burning or tingling in your legs or feet.  You have pain or cramps in your legs and feet.  Your legs or feet are numb.  Your feet always feel cold.  You have pain around a toenail. Get help right away if:  You have a wound, scrape, corn, or callus on your foot and: ? You have pain, swelling, or redness that gets worse. ? You have fluid or blood coming from the wound, scrape, corn, or callus. ? Your wound, scrape, corn, or callus feels warm to the touch. ? You have pus or a bad smell coming from the wound, scrape, corn, or callus. ? You have a fever. ? You have a red line going up your leg. Summary  Check your feet every day  for cuts, sores, red spots, swelling, and blisters.  Moisturize feet and legs daily.  Wear shoes that fit properly and have enough cushioning.  If you have foot problems, report any cuts, sores, or bruises to your health care provider immediately.  Schedule a complete foot exam at least once a year (annually) or more often if you have foot problems. This information is not intended to replace advice given to you by your health care provider. Make sure you discuss any questions you have with your health care provider. Document Released: 06/14/2000 Document Revised: 07/30/2017 Document Reviewed: 07/19/2016 Elsevier Patient Education  2020 [employer]  

## 2019-06-28 NOTE — Progress Notes (Signed)
Subjective: Megan Golden is a 73 y.o. y.o. female with h/o diabetes who presents today for preventative diabetic foot care. Patient has painful, elongated mycotic toenails, corn(s) and callus(es) which pose a risk and interfere with daily activities. Pain is aggravated when wearing enclosed shoe gear and relieved with periodic professional debridement.  Reita Cliche, MD is patient's PCP.   Medications reviewed in chart.  Allergies  Allergen Reactions  . Azithromycin Hives and Rash  . Ciprofloxacin Hives and Rash  . Hydrocodone-Acetaminophen Hives and Rash  . Levofloxacin Hives and Rash   Objective: There were no vitals filed for this visit.  Vascular Examination: Capillary refill time immediate b/l.  Dorsalis pedis pulses palpable b/l.  Posterior tibial pulses palpable b/l.  Digital hair decreased b/l.  Skin temperature gradient WNL b/l.  Dermatological Examination: Skin with normal turgor, texture and tone b/l.  Toenails b/l great toes and 3-5 b/l discolored, thick, dystrophic with subungual debris and pain with palpation to nailbeds due to thickness of nails.  Musculoskeletal: Muscle strength 5/5 to all LE muscle groups b/l.  Hammertoes 2-5 b/l.  HAV with bunion b/l.  Neurological: Sensation intact decreased b/l with 10 gram monofilament.  Vibratory sensation intact b/l.  Assessment: 1. Painful onychomycosis toenails 1-5 b/l 2.   NIDDM with neuropathy  Plan: 1. Continue diabetic foot care principles. Literature dispensed on today. 2. Toenails 1-5 b/l were debrided in length and girth without iatrogenic bleeding. 3. Patient to continue soft, supportive shoe gear daily. 4. Patient to report any pedal injuries to medical professional immediately. 5. Follow up 3 months.  6. Patient/POA to call should there be a concern in the interim.

## 2019-09-21 ENCOUNTER — Encounter: Payer: Self-pay | Admitting: Podiatry

## 2019-09-21 ENCOUNTER — Other Ambulatory Visit: Payer: Self-pay

## 2019-09-21 ENCOUNTER — Ambulatory Visit (INDEPENDENT_AMBULATORY_CARE_PROVIDER_SITE_OTHER): Payer: Medicare Other | Admitting: Podiatry

## 2019-09-21 VITALS — Temp 96.2°F

## 2019-09-21 DIAGNOSIS — E1140 Type 2 diabetes mellitus with diabetic neuropathy, unspecified: Secondary | ICD-10-CM | POA: Diagnosis not present

## 2019-09-21 DIAGNOSIS — B351 Tinea unguium: Secondary | ICD-10-CM

## 2019-09-21 DIAGNOSIS — M79675 Pain in left toe(s): Secondary | ICD-10-CM

## 2019-09-21 DIAGNOSIS — M79674 Pain in right toe(s): Secondary | ICD-10-CM

## 2019-09-21 NOTE — Progress Notes (Signed)
Subjective: Megan Golden presents today for follow up of preventative diabetic foot care and painful mycotic nails b/l that are difficult to trim. Pain interferes with ambulation. Aggravating factors include wearing enclosed shoe gear. Pain is relieved with periodic professional debridement.   She voices no new pedal problems on today's visit.  Allergies  Allergen Reactions  . Azithromycin Golden and Rash  . Ciprofloxacin Golden and Rash  . Hydrocodone-Acetaminophen Golden and Rash  . Levofloxacin Golden and Rash     Objective: Vitals:   09/21/19 0902  Temp: (!) 96.2 F (35.7 C)    Pt is a  74 y.o. year old Caucasian  female WD, WN in NAD. AAO x 3.   Vascular Examination:  Capillary refill time to digits immediate b/l. Palpable DP pulses b/l. Palpable PT pulses b/l. Pedal hair sparse b/l. Skin temperature gradient within normal limits b/l.  Dermatological Examination: Pedal skin with normal turgor, texture and tone bilaterally. No open wounds bilaterally. No interdigital macerations bilaterally. Toenails 1-5 b/l elongated, dystrophic, thickened, crumbly with subungual debris and tenderness to dorsal palpation.  Musculoskeletal: Normal muscle strength 5/5 to all lower extremity muscle groups bilaterally, no pain crepitus or joint limitation noted with ROM b/l, bunion deformity noted b/l and hammertoes noted to the  2-5 bilaterally  Neurological: Protective sensation decreased with 10 gram monofilament b/l Vibratory sensation decreased b/l  Assessment: No diagnosis found.  Plan: -Continue diabetic foot care principles. Literature dispensed on today.  -Toenails 1-5 b/l were debrided in length and girth with sterile nail nippers and dremel without iatrogenic bleeding.  -Patient to continue soft, supportive shoe gear daily. -Patient to report any pedal injuries to medical professional immediately. -Patient/POA to call should there be question/concern in the interim.  Return in  about 3 months (around 12/22/2019) for diabetic nail trim.

## 2019-09-21 NOTE — Patient Instructions (Signed)
Diabetes Mellitus and Foot Care Foot care is an important part of your health, especially when you have diabetes. Diabetes may cause you to have problems because of poor blood flow (circulation) to your feet and legs, which can cause your skin to:  Become thinner and drier.  Break more easily.  Heal more slowly.  Peel and crack. You may also have nerve damage (neuropathy) in your legs and feet, causing decreased feeling in them. This means that you may not notice minor injuries to your feet that could lead to more serious problems. Noticing and addressing any potential problems early is the best way to prevent future foot problems. How to care for your feet Foot hygiene  Wash your feet daily with warm water and mild soap. Do not use hot water. Then, pat your feet and the areas between your toes until they are completely dry. Do not soak your feet as this can dry your skin.  Trim your toenails straight across. Do not dig under them or around the cuticle. File the edges of your nails with an emery board or nail file.  Apply a moisturizing lotion or petroleum jelly to the skin on your feet and to dry, brittle toenails. Use lotion that does not contain alcohol and is unscented. Do not apply lotion between your toes. Shoes and socks  Wear clean socks or stockings every day. Make sure they are not too tight. Do not wear knee-high stockings since they may decrease blood flow to your legs.  Wear shoes that fit properly and have enough cushioning. Always look in your shoes before you put them on to be sure there are no objects inside.  To break in new shoes, wear them for just a few hours a day. This prevents injuries on your feet. Wounds, scrapes, corns, and calluses  Check your feet daily for blisters, cuts, bruises, sores, and redness. If you cannot see the bottom of your feet, use a mirror or ask someone for help.  Do not cut corns or calluses or try to remove them with medicine.  If you  find a minor scrape, cut, or break in the skin on your feet, keep it and the skin around it clean and dry. You may clean these areas with mild soap and water. Do not clean the area with peroxide, alcohol, or iodine.  If you have a wound, scrape, corn, or callus on your foot, look at it several times a day to make sure it is healing and not infected. Check for: ? Redness, swelling, or pain. ? Fluid or blood. ? Warmth. ? Pus or a bad smell. General instructions  Do not cross your legs. This may decrease blood flow to your feet.  Do not use heating pads or hot water bottles on your feet. They may burn your skin. If you have lost feeling in your feet or legs, you may not know this is happening until it is too late.  Protect your feet from hot and cold by wearing shoes, such as at the beach or on hot pavement.  Schedule a complete foot exam at least once a year (annually) or more often if you have foot problems. If you have foot problems, report any cuts, sores, or bruises to your health care provider immediately. Contact a health care provider if:  You have a medical condition that increases your risk of infection and you have any cuts, sores, or bruises on your feet.  You have an injury that is not   healing.  You have redness on your legs or feet.  You feel burning or tingling in your legs or feet.  You have pain or cramps in your legs and feet.  Your legs or feet are numb.  Your feet always feel cold.  You have pain around a toenail. Get help right away if:  You have a wound, scrape, corn, or callus on your foot and: ? You have pain, swelling, or redness that gets worse. ? You have fluid or blood coming from the wound, scrape, corn, or callus. ? Your wound, scrape, corn, or callus feels warm to the touch. ? You have pus or a bad smell coming from the wound, scrape, corn, or callus. ? You have a fever. ? You have a red line going up your leg. Summary  Check your feet every day  for cuts, sores, red spots, swelling, and blisters.  Moisturize feet and legs daily.  Wear shoes that fit properly and have enough cushioning.  If you have foot problems, report any cuts, sores, or bruises to your health care provider immediately.  Schedule a complete foot exam at least once a year (annually) or more often if you have foot problems. This information is not intended to replace advice given to you by your health care provider. Make sure you discuss any questions you have with your health care provider. Document Revised: 03/10/2019 Document Reviewed: 07/19/2016 Elsevier Patient Education  2020 Elsevier Inc.  

## 2019-10-18 DIAGNOSIS — M79643 Pain in unspecified hand: Secondary | ICD-10-CM | POA: Insufficient documentation

## 2019-10-18 DIAGNOSIS — R209 Unspecified disturbances of skin sensation: Secondary | ICD-10-CM | POA: Insufficient documentation

## 2019-12-22 ENCOUNTER — Ambulatory Visit (INDEPENDENT_AMBULATORY_CARE_PROVIDER_SITE_OTHER): Payer: Medicare Other | Admitting: Podiatry

## 2019-12-22 ENCOUNTER — Other Ambulatory Visit: Payer: Self-pay

## 2019-12-22 ENCOUNTER — Encounter: Payer: Self-pay | Admitting: Podiatry

## 2019-12-22 DIAGNOSIS — E1140 Type 2 diabetes mellitus with diabetic neuropathy, unspecified: Secondary | ICD-10-CM

## 2019-12-22 DIAGNOSIS — B351 Tinea unguium: Secondary | ICD-10-CM

## 2019-12-22 DIAGNOSIS — M79674 Pain in right toe(s): Secondary | ICD-10-CM

## 2019-12-22 DIAGNOSIS — M79675 Pain in left toe(s): Secondary | ICD-10-CM

## 2019-12-22 DIAGNOSIS — R809 Proteinuria, unspecified: Secondary | ICD-10-CM | POA: Insufficient documentation

## 2019-12-22 NOTE — Progress Notes (Signed)
Subjective: Megan Golden is a 74 y.o. female patient seen today preventative diabetic foot care and painful mycotic nails b/l that are difficult to trim. Pain interferes with ambulation. Aggravating factors include wearing enclosed shoe gear. Pain is relieved with periodic professional debridement.  Past Medical History:  Diagnosis Date  . Asthma   . DM2 (diabetes mellitus, type 2) ([place])   . GERD (gastroesophageal reflux disease)   . HTN (hypertension)   . Pulmonary nodule   . RA (rheumatoid arthritis) ([facility])    RF positive    Patient Active Problem List   Diagnosis Date Noted  . Diarrhea 02/11/2019  . Globus sensation 02/11/2019  . Nuclear sclerotic cataract of right eye 02/08/2019  . Aortic atherosclerosis ([place]) 10/07/2018  . Chronic diastolic heart failure ([place]) 10/07/2018  . History of 2019 novel coronavirus disease (COVID-19) 10/07/2018  . Hypertriglyceridemia 10/07/2018  . Immunosuppression ([place]) 10/07/2018  . Acute respiratory disease due to COVID-19 virus 09/29/2018  . Rheumatoid arthritis ([place]) 09/29/2018  . Diabetic peripheral neuropathy ([place]) 02/10/2018  . Posterior vitreous detachment, bilateral 12/29/2017  . Vitreous floaters, bilateral 12/29/2017  . Hyperuricemia 11/11/2017  . Rheumatoid arthritis involving both hands with positive rheumatoid factor ([place]) 11/11/2017  . Osteoarthritis of cervical spine 10/23/2017  . Localized edema 07/04/2017  . Bilateral lower extremity edema 07/04/2017  . Dysfunction of both eustachian tubes 05/12/2017  . Oral thrush 05/12/2017  . Arthritis of hand, right 02/19/2017  . Pulmonary nodule 12/24/2016  . Type 2 diabetes mellitus without retinopathy ([place]) 12/18/2016  . Age-related osteoporosis without current pathological fracture 11/15/2016  . Chronic bilateral back pain 11/07/2016  . History of breast cancer 08/09/2016  . Moderate persistent asthma with acute exacerbation 08/09/2016  . Aftercare following surgery of the  musculoskeletal system 07/14/2016  . Bunion 02/19/2016  . Allergic rhinitis 02/08/2016  . 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 Diabetes mellitus ([place]) 08/09/2015  . Arthritis of elbow, left 03/23/2015  . Laryngopharyngeal reflux 06/06/2014  . Other tenosynovitis of hand and wrist 02/28/2014  . Primary localized osteoarthrosis of forearm 02/28/2014  . Shortness of breath 10/07/2012  . Keratoconjunctivitis sicca not due to Sjogren's syndrome 09/17/2011  . Malignant neoplasm of female breast ([place]) 09/17/2011  . Sjogren's syndrome ([place]) 09/17/2011  . OBSTRUCTIVE SLEEP APNEA 06/03/2007  . Essential hypertension 06/03/2007  . GERD 06/03/2007  . COUGH 06/03/2007    Current Outpatient Medications on File Prior to Visit  Medication Sig Dispense Refill  . albuterol (PROVENTIL HFA;VENTOLIN HFA) 108 (90 Base) MCG/ACT inhaler Inhale 1-2 puffs into the lungs every 4 (four) hours as needed for wheezing or shortness of breath.     . allopurinol (ZYLOPRIM) 100 MG tablet Take 100 mg by mouth daily.     . Ascorbic Acid (VITAMIN C PO) Take 1 tablet by mouth daily.     Marland Kitchen aspirin EC 81 MG tablet Take 81 mg by mouth daily.    Marland Kitchen atorvastatin (LIPITOR) 10 MG tablet Take 5 mg by mouth daily.  11  . b complex vitamins capsule Take 1 capsule by mouth daily.    . brimonidine (ALPHAGAN) 0.2 % ophthalmic solution Place 1 drop into the right eye 3 (three) times daily.    . Calcium Carbonate-Vitamin D (OYSTER SHELL CALCIUM/D) 250-125 MG-UNIT TABS Take by mouth.    . Calcium-Magnesium-Vitamin D (CALCIUM MAGNESIUM PO) Take 1 tablet by mouth daily. Plus [id number] mg-unit     . Cyanocobalamin (VITAMIN B-12 PO) Take 1 tablet  by mouth daily.     Marland Kitchen denosumab (PROLIA) 60 MG/ML SOSY injection Inject into the skin.    . DULoxetine (CYMBALTA) 60 MG capsule Take 60 mg by mouth daily.    Marland Kitchen FARXIGA 5 MG TABS tablet Take 5 mg by mouth daily.    . fexofenadine (ALLEGRA) 180 MG tablet  Take 180 mg by mouth daily.     . Flaxseed, Linseed, (FLAXSEED OIL) 1000 MG CAPS Take 1 capsule by mouth daily.     . fluconazole (DIFLUCAN) 100 MG tablet Take by mouth.    . fluticasone (FLONASE) 50 MCG/ACT nasal spray Place 1 spray into both nostrils daily as needed for allergies.     . fluticasone (FLOVENT DISKUS) 50 MCG/BLIST diskus inhaler Inhale into the lungs.    . Fluticasone Furoate (ARNUITY ELLIPTA) 100 MCG/ACT AEPB Inhale 1 puff into the lungs daily.     . folic acid (FOLVITE) 1 MG tablet Take 1 mg by mouth daily.  0  . furosemide (LASIX) 20 MG tablet Take 1 tablet (20 mg total) by mouth daily. Restart in  1 week    . gabapentin (NEURONTIN) 800 MG tablet Take 800 mg by mouth 5 (five) times daily.    Marland Kitchen glimepiride (AMARYL) 1 MG tablet Take 1 mg by mouth daily with breakfast.     . glucose blood (ONETOUCH VERIO) test strip USE TO CHECK BLOOD SUGAR EVERY DAY    . losartan-hydrochlorothiazide (HYZAAR) 100-12.5 MG tablet Take 1 tablet by mouth at bedtime.  2  . metFORMIN (GLUCOPHAGE) 500 MG tablet Take 500 mg by mouth every morning.    . metFORMIN (GLUCOPHAGE-XR) 500 MG 24 hr tablet Take 1 tablet (500 mg total) by mouth 2 (two) times daily. Restart in 1 week    . methotrexate (RHEUMATREX) 2.5 MG tablet TAKE 6 TABLETS BY MOUTH ONCE WEEKLY    . metoprolol succinate (TOPROL-XL) 50 MG 24 hr tablet Take 50 mg by mouth daily.     . montelukast (SINGULAIR) 10 MG tablet Take 10 mg by mouth at bedtime.     . Multiple Vitamin (MULTIVITAMIN) capsule Take 1 capsule by mouth daily.     . nitrofurantoin, macrocrystal-monohydrate, (MACROBID) 100 MG capsule Take 100 mg by mouth 2 (two) times daily.    . NONFORMULARY OR COMPOUNDED ITEM Apply 1-2 g topically 4 (four) times daily. [facility]  Peripheral Neuropathy Cream- Bupivacaine 1%, Doxepin 3%, Gabapentin 6%, Pentoxifylline 3%, Topiramate 1% Apply 1-2 grams to affected area 3-4 times daily Qty. 120 gm 3 refills 120 each 2  . Omega-3 1000 MG  CAPS Take by mouth.    . omega-3 acid ethyl esters (LOVAZA) 1 g capsule Take 1 g by mouth daily.    Marland Kitchen omeprazole (PRILOSEC) 20 MG capsule Take 40 mg by mouth 2 (two) times daily.  5  . OXcarbazepine (TRILEPTAL) 150 MG tablet Take 150 mg by mouth 2 (two) times daily.    . potassium chloride (K-DUR) 10 MEQ tablet Take 10 mEq by mouth 2 (two) times daily.    . Potassium Gluconate 2.5 MEQ TABS Take by mouth.    . predniSONE (DELTASONE) 10 MG tablet Take by mouth.    . sodium chloride (OCEAN NASAL SPRAY) 0.65 % nasal spray Place 1 spray into the nose as needed for congestion.    . traMADol (ULTRAM) 50 MG tablet Take 50 mg by mouth every 6 (six) hours as needed.    . valACYclovir (VALTREX) 1000 MG tablet Take 1,000 mg by  mouth 3 (three) times daily.    . vitamin E 100 UNIT capsule Take 100 Units by mouth daily.     No current facility-administered medications on file prior to visit.    Allergies  Allergen Reactions  . Azithromycin Hives and Rash  . Ciprofloxacin Hives and Rash  . Hydrocodone-Acetaminophen Hives and Rash  . Levofloxacin Hives and Rash  Objective: Physical Exam  General: Patient is a pleasant 74 y.o. Caucasian female WD, WN in NAD. AAO x 3.   Neurovascular Examination: Neurovascular status unchanged b/l lower extremities. Capillary refill time to digits immediate b/l. Palpable pedal pulses b/l LE. Pedal hair sparse. Lower extremity skin temperature gradient within normal limits.   Protective sensation decreased with 10 gram monofilament b/l. Vibratory sensation decreased b/l. Proprioception intact bilaterally.  Dermatological:  Pedal skin with normal turgor, texture and tone bilaterally. No open wounds bilaterally. No interdigital macerations bilaterally. Toenails 1-5 b/l elongated, discolored, dystrophic, thickened, crumbly with subungual debris and tenderness to dorsal palpation.  Musculoskeletal:  Normal muscle strength 5/5 to all lower extremity muscle groups  bilaterally. No pain crepitus or joint limitation noted with ROM b/l. Hallux valgus with bunion deformity noted b/l lower extremities. Hammertoes noted to the 2-5 bilaterally.  Assessment and Plan:  1. Pain due to onychomycosis of toenails of both feet   2. Diabetic neuropathy, painful ([place])    -Examined patient. -No new findings. No new orders. -Continue diabetic foot care principles. -Toenails 1-5 b/l were debrided in length and girth with sterile nail nippers and dremel without iatrogenic bleeding.  -Patient to report any pedal injuries to medical professional immediately. -Patient to continue soft, supportive shoe gear daily. -Patient/POA to call should there be question/concern in the interim.  Return in about 3 months (around 03/23/2020) for diabetic nail and callus trim.  Marzetta Board, DPM

## 2019-12-22 NOTE — Patient Instructions (Signed)
Diabetes Mellitus and Foot Care Foot care is an important part of your health, especially when you have diabetes. Diabetes may cause you to have problems because of poor blood flow (circulation) to your feet and legs, which can cause your skin to:  Become thinner and drier.  Break more easily.  Heal more slowly.  Peel and crack. You may also have nerve damage (neuropathy) in your legs and feet, causing decreased feeling in them. This means that you may not notice minor injuries to your feet that could lead to more serious problems. Noticing and addressing any potential problems early is the best way to prevent future foot problems. How to care for your feet Foot hygiene  Wash your feet daily with warm water and mild soap. Do not use hot water. Then, pat your feet and the areas between your toes until they are completely dry. Do not soak your feet as this can dry your skin.  Trim your toenails straight across. Do not dig under them or around the cuticle. File the edges of your nails with an emery board or nail file.  Apply a moisturizing lotion or petroleum jelly to the skin on your feet and to dry, brittle toenails. Use lotion that does not contain alcohol and is unscented. Do not apply lotion between your toes. Shoes and socks  Wear clean socks or stockings every day. Make sure they are not too tight. Do not wear knee-high stockings since they may decrease blood flow to your legs.  Wear shoes that fit properly and have enough cushioning. Always look in your shoes before you put them on to be sure there are no objects inside.  To break in new shoes, wear them for just a few hours a day. This prevents injuries on your feet. Wounds, scrapes, corns, and calluses  Check your feet daily for blisters, cuts, bruises, sores, and redness. If you cannot see the bottom of your feet, use a mirror or ask someone for help.  Do not cut corns or calluses or try to remove them with medicine.  If you  find a minor scrape, cut, or break in the skin on your feet, keep it and the skin around it clean and dry. You may clean these areas with mild soap and water. Do not clean the area with peroxide, alcohol, or iodine.  If you have a wound, scrape, corn, or callus on your foot, look at it several times a day to make sure it is healing and not infected. Check for: ? Redness, swelling, or pain. ? Fluid or blood. ? Warmth. ? Pus or a bad smell. General instructions  Do not cross your legs. This may decrease blood flow to your feet.  Do not use heating pads or hot water bottles on your feet. They may burn your skin. If you have lost feeling in your feet or legs, you may not know this is happening until it is too late.  Protect your feet from hot and cold by wearing shoes, such as at the beach or on hot pavement.  Schedule a complete foot exam at least once a year (annually) or more often if you have foot problems. If you have foot problems, report any cuts, sores, or bruises to your health care provider immediately. Contact a health care provider if:  You have a medical condition that increases your risk of infection and you have any cuts, sores, or bruises on your feet.  You have an injury that is not   healing.  You have redness on your legs or feet.  You feel burning or tingling in your legs or feet.  You have pain or cramps in your legs and feet.  Your legs or feet are numb.  Your feet always feel cold.  You have pain around a toenail. Get help right away if:  You have a wound, scrape, corn, or callus on your foot and: ? You have pain, swelling, or redness that gets worse. ? You have fluid or blood coming from the wound, scrape, corn, or callus. ? Your wound, scrape, corn, or callus feels warm to the touch. ? You have pus or a bad smell coming from the wound, scrape, corn, or callus. ? You have a fever. ? You have a red line going up your leg. Summary  Check your feet every day  for cuts, sores, red spots, swelling, and blisters.  Moisturize feet and legs daily.  Wear shoes that fit properly and have enough cushioning.  If you have foot problems, report any cuts, sores, or bruises to your health care provider immediately.  Schedule a complete foot exam at least once a year (annually) or more often if you have foot problems. This information is not intended to replace advice given to you by your health care provider. Make sure you discuss any questions you have with your health care provider. Document Revised: 03/10/2019 Document Reviewed: 07/19/2016 Elsevier Patient Education  2020 Elsevier Inc.  

## 2020-01-02 NOTE — Progress Notes (Signed)
  Subjective:  Patient ID: Megan Golden, female    DOB: 05/25/1946,  MRN: [id number]  Chief Complaint  Patient presents with  . Nail Problem    Pt states left 1st nail is inflamed and painful 2wk duration. Pt states history of ingrown removal and believes it may be related to an ingrown. Pt states no drainage and denies fever/nausea/vomiting/chills.    74 y.o. female presents with the above complaint. History confirmed with patient.   Objective:  Physical Exam: warm, good capillary refill, no trophic changes or ulcerative lesions, normal DP and PT pulses and normal sensory exam.  Painful ingrowing nail of the left hallux without warmth, erythema or drainage  Assessment:   1. Ingrown nail   2. Pain around toenail      Plan:  Patient was evaluated and treated and all questions answered.  Ingrown Nail, left -Patient elects to proceed with ingrown toenail removal today -Ingrown nail excised. See procedure note. -Rx Keflex -Educated on post-procedure care including soaking. Written instructions provided. -Patient to follow up in 2 weeks for nail check.  Procedure: Avulsion of toenail Location: Left 1st toe  Anesthesia: Lidocaine 1% plain; 1.5 mL and Marcaine 0.5% plain; 1.5 mL, digital block. Skin Prep: Betadine. Dressing: Silvadene; telfa; dry, sterile, compression dressing. Technique: Following skin prep, the toe was exsanguinated and a tourniquet was secured at the base of the toe. The nail was freed and avulsed with a hemostat. The area was cleansed. The tourniquet was then removed and sterile dressing applied. Disposition: Patient tolerated procedure well.   Return in about 2 weeks (around 01/28/2019).   MDM

## 2020-01-17 DIAGNOSIS — M810 Age-related osteoporosis without current pathological fracture: Secondary | ICD-10-CM | POA: Insufficient documentation

## 2020-02-24 ENCOUNTER — Encounter: Payer: Self-pay | Admitting: Family Medicine

## 2020-02-24 ENCOUNTER — Other Ambulatory Visit: Payer: Self-pay

## 2020-02-24 ENCOUNTER — Ambulatory Visit: Payer: Self-pay

## 2020-02-24 ENCOUNTER — Ambulatory Visit (INDEPENDENT_AMBULATORY_CARE_PROVIDER_SITE_OTHER): Payer: Medicare Other | Admitting: Family Medicine

## 2020-02-24 DIAGNOSIS — M25551 Pain in right hip: Secondary | ICD-10-CM

## 2020-02-24 DIAGNOSIS — M5441 Lumbago with sciatica, right side: Secondary | ICD-10-CM | POA: Diagnosis not present

## 2020-02-24 NOTE — Progress Notes (Signed)
Office Visit Note   Patient: Megan Golden           Date of Birth: [birth year]           MRN: [id number] Visit Date: 02/24/2020 Requested by: Reita Cliche, MD [address] [place],  [place] [address] PCP: Reita Cliche, MD  Subjective: Chief Complaint  Patient presents with  . Lower Back - Pain  . Right Hip - Pain    HPI: She is here with right-sided back and hip pain.  Symptoms started a while ago on a mild basis, but more intense in the past few weeks.  No trauma that she can think of.  She has not noticed fevers, chills, rash.  She has been using gabapentin with some relief of symptoms.  Her pain starts in the right lower back/posterior hip and radiates around the hip into the groin area and down toward the knee.  It hurts worse when she stands up straight, feels better when she sits down.  She is status post L4-5 fusion per Dr. Saintclair Halsted in 2007.  At that time she was having left-sided sciatica.  She has done well since then.  She has diabetes which has been controlled.  She also has hypertension.  She has known atherosclerosis.               ROS:   All other systems were reviewed and are negative.  Objective: Vital Signs: There were no vitals taken for this visit.  Physical Exam:  General:  Alert and oriented, in no acute distress. Pulm:  Breathing unlabored. Psy:  Normal mood, congruent affect. Skin: No rash Low back: Well-healed lower lumbar surgical scar.  She has no tenderness over the spinous processes.  Pain is not reproducible by palpation.  No tenderness over the SI joint or in the sciatic notch, no tenderness over the greater trochanter.  She has decreased range of motion but no pain with passive hip flexion and internal rotation.  5/5 hip flexion, knee extension, foot and ankle strength with 2+ DTRs.   Imaging: XR HIP UNILAT W OR W/O PELVIS 2-3 VIEWS RIGHT  Result Date: 02/24/2020 X-rays of the right hip reveal very mild degenerative change.  No sign  of stress fracture or neoplasm.  XR Lumbar Spine 2-3 Views  Result Date: 02/24/2020 X-rays lumbar spine reveal intact fusion hardware at L4-5, no sign of loosening or infection.  She has moderate to severe degenerative disc disease at L5-S1, as well as L1-2 and L2-3.  On the AP view there is asymmetric narrowing of the L2-3 disc space with the right side much more narrow than the left resulting in curvature.  There is lateral spurring as well.  No compression fracture seen, no sign of neoplasm.  Postsurgical gallbladder clips are present.  Aortic atherosclerosis is present.   Assessment & Plan: 1.  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 -Discussed options with her and elected to try physical therapy at [facility].  She also is interested in chiropractic per Dr. Purcell Nails. -She will continue with gabapentin for pain. -If she fails to improve we will contemplate MRI scan followed by epidural injection if indicated.     Procedures: No procedures performed  No notes on file     PMFS History: Patient Active Problem List   Diagnosis Date Noted  . Osteoporosis 01/17/2020  . Microalbuminuria 12/22/2019  . Hand pain 10/18/2019  . Skin sensation disturbance 10/18/2019  .  Diarrhea 02/11/2019  . Globus sensation 02/11/2019  . Nuclear sclerotic cataract of right eye 02/08/2019  . Aortic atherosclerosis ([place]) 10/07/2018  . Chronic diastolic heart failure ([place]) 10/07/2018  . History of 2019 novel coronavirus disease (COVID-19) 10/07/2018  . Hypertriglyceridemia 10/07/2018  . Immunosuppression ([place]) 10/07/2018  . Acute respiratory disease due to COVID-19 virus 09/29/2018  . Rheumatoid arthritis ([place]) 09/29/2018  . Diabetic peripheral neuropathy ([place]) 02/10/2018  . Posterior vitreous detachment, bilateral 12/29/2017  . Vitreous floaters, bilateral 12/29/2017  . Hyperuricemia 11/11/2017  . Rheumatoid arthritis involving both hands with positive  rheumatoid factor ([place]) 11/11/2017  . Osteoarthritis of cervical spine 10/23/2017  . Localized edema 07/04/2017  . Bilateral lower extremity edema 07/04/2017  . Dysfunction of both eustachian tubes 05/12/2017  . Oral thrush 05/12/2017  . Arthritis of hand, right 02/19/2017  . Pulmonary nodule 12/24/2016  . Type 2 diabetes mellitus without retinopathy ([place]) 12/18/2016  . Age-related osteoporosis without current pathological fracture 11/15/2016  . Chronic bilateral back pain 11/07/2016  . History of breast cancer 08/09/2016  . Moderate persistent asthma with acute exacerbation 08/09/2016  . Aftercare following surgery of the musculoskeletal system 07/14/2016  . Bunion 02/19/2016  . Allergic rhinitis 02/08/2016  . 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 Diabetes mellitus ([place]) 08/09/2015  . Arthritis of elbow, left 03/23/2015  . Laryngopharyngeal reflux 06/06/2014  . Other tenosynovitis of hand and wrist 02/28/2014  . Primary localized osteoarthrosis of forearm 02/28/2014  . Shortness of breath 10/07/2012  . Keratoconjunctivitis sicca not due to Sjogren's syndrome 09/17/2011  . Malignant neoplasm of female breast ([place]) 09/17/2011  . Sjogren's syndrome ([place]) 09/17/2011  . OBSTRUCTIVE SLEEP APNEA 06/03/2007  . Essential hypertension 06/03/2007  . GERD 06/03/2007  . COUGH 06/03/2007   Past Medical History:  Diagnosis Date  . Asthma   . DM2 (diabetes mellitus, type 2) ([place])   . GERD (gastroesophageal reflux disease)   . HTN (hypertension)   . Pulmonary nodule   . RA (rheumatoid arthritis) ([facility])    RF positive    Family History  Problem Relation Age of Onset  . Pneumonia Other        Died of Coronavirus 10/21/2018  . Hypertension Mother   . Diabetes Mother   . Stroke Mother   . Emphysema Mother   . Hypertension Father   . Emphysema Father   . Heart disease Father   . Diabetes Sister   . Osteoporosis Sister   . Emphysema Sister       Past Surgical History:  Procedure Laterality Date  . BREAST LUMPECTOMY Left 2007  . ROTATOR CUFF REPAIR     Social History   Occupational History  . Not on file  Tobacco Use  . Smoking status: Former Research scientist (life sciences)  . Smokeless tobacco: Never Used  Substance and Sexual Activity  . Alcohol use: Not Currently  . Drug use: Never  . Sexual activity: Not on file

## 2020-02-24 NOTE — Progress Notes (Signed)
Hx of lower back sx Rt sided lower back pain with leg and groin pain Take gabapentin and tylenol for pain

## 2020-03-01 ENCOUNTER — Other Ambulatory Visit: Payer: Self-pay

## 2020-03-01 ENCOUNTER — Encounter (INDEPENDENT_AMBULATORY_CARE_PROVIDER_SITE_OTHER): Payer: Medicare Other | Admitting: Ophthalmology

## 2020-03-01 DIAGNOSIS — H35372 Puckering of macula, left eye: Secondary | ICD-10-CM

## 2020-03-01 DIAGNOSIS — H59032 Cystoid macular edema following cataract surgery, left eye: Secondary | ICD-10-CM | POA: Diagnosis not present

## 2020-03-01 DIAGNOSIS — H43813 Vitreous degeneration, bilateral: Secondary | ICD-10-CM

## 2020-03-01 DIAGNOSIS — I10 Essential (primary) hypertension: Secondary | ICD-10-CM

## 2020-03-01 DIAGNOSIS — H35033 Hypertensive retinopathy, bilateral: Secondary | ICD-10-CM | POA: Diagnosis not present

## 2020-04-05 ENCOUNTER — Ambulatory Visit: Payer: Medicare Other | Admitting: Podiatry

## 2020-04-07 ENCOUNTER — Encounter: Payer: Self-pay | Admitting: Podiatry

## 2020-04-07 ENCOUNTER — Ambulatory Visit (INDEPENDENT_AMBULATORY_CARE_PROVIDER_SITE_OTHER): Payer: Medicare Other | Admitting: Podiatry

## 2020-04-07 ENCOUNTER — Other Ambulatory Visit: Payer: Self-pay

## 2020-04-07 DIAGNOSIS — B351 Tinea unguium: Secondary | ICD-10-CM

## 2020-04-07 DIAGNOSIS — M79674 Pain in right toe(s): Secondary | ICD-10-CM | POA: Diagnosis not present

## 2020-04-07 DIAGNOSIS — E119 Type 2 diabetes mellitus without complications: Secondary | ICD-10-CM

## 2020-04-07 DIAGNOSIS — M2011 Hallux valgus (acquired), right foot: Secondary | ICD-10-CM

## 2020-04-07 DIAGNOSIS — E1140 Type 2 diabetes mellitus with diabetic neuropathy, unspecified: Secondary | ICD-10-CM

## 2020-04-07 DIAGNOSIS — M79675 Pain in left toe(s): Secondary | ICD-10-CM | POA: Diagnosis not present

## 2020-04-07 DIAGNOSIS — S90414A Abrasion, right lesser toe(s), initial encounter: Secondary | ICD-10-CM

## 2020-04-07 DIAGNOSIS — M2041 Other hammer toe(s) (acquired), right foot: Secondary | ICD-10-CM | POA: Diagnosis not present

## 2020-04-07 DIAGNOSIS — M2012 Hallux valgus (acquired), left foot: Secondary | ICD-10-CM

## 2020-04-07 DIAGNOSIS — M2042 Other hammer toe(s) (acquired), left foot: Secondary | ICD-10-CM

## 2020-04-07 NOTE — Progress Notes (Signed)
ANNUAL DIABETIC FOOT EXAM  Subjective: Megan Golden presents today for for annual diabetic foot examination, at risk foot care with history of diabetic neuropathy and painful thick toenails that are difficult to trim. Pain interferes with ambulation. Aggravating factors include wearing enclosed shoe gear. Pain is relieved with periodic professional debridement.  She states she wore a pair of dress shoes to a relative's wedding and has a spot on her right 2nd digit. She denies any redness, drainage or swelling. Denies any fever, chills night sweats, nausea or vomiting.  Past Medical History:  Diagnosis Date  . Asthma   . DM2 (diabetes mellitus, type 2) ([place])   . GERD (gastroesophageal reflux disease)   . HTN (hypertension)   . Pulmonary nodule   . RA (rheumatoid arthritis) ([facility])    RF positive    Patient Active Problem List   Diagnosis Date Noted  . Osteoporosis 01/17/2020  . Microalbuminuria 12/22/2019  . Hand pain 10/18/2019  . Skin sensation disturbance 10/18/2019  . Diarrhea 02/11/2019  . Globus sensation 02/11/2019  . Nuclear sclerotic cataract of right eye 02/08/2019  . Aortic atherosclerosis ([place]) 10/07/2018  . Chronic diastolic heart failure ([place]) 10/07/2018  . History of 2019 novel coronavirus disease (COVID-19) 10/07/2018  . Hypertriglyceridemia 10/07/2018  . Immunosuppression ([place]) 10/07/2018  . Acute respiratory disease due to COVID-19 virus 09/29/2018  . Rheumatoid arthritis ([place]) 09/29/2018  . Diabetic peripheral neuropathy ([place]) 02/10/2018  . Posterior vitreous detachment, bilateral 12/29/2017  . Vitreous floaters, bilateral 12/29/2017  . Hyperuricemia 11/11/2017  . Rheumatoid arthritis involving both hands with positive rheumatoid factor ([place]) 11/11/2017  . Osteoarthritis of cervical spine 10/23/2017  . Localized edema 07/04/2017  . Bilateral lower extremity edema 07/04/2017  . Dysfunction of both eustachian tubes 05/12/2017  . Oral thrush 05/12/2017  .  Arthritis of hand, right 02/19/2017  . Pulmonary nodule 12/24/2016  . Type 2 diabetes mellitus without retinopathy ([place]) 12/18/2016  . Age-related osteoporosis without current pathological fracture 11/15/2016  . Chronic bilateral back pain 11/07/2016  . History of breast cancer 08/09/2016  . Moderate persistent asthma with acute exacerbation 08/09/2016  . Aftercare following surgery of the musculoskeletal system 07/14/2016  . Bunion 02/19/2016  . Allergic rhinitis 02/08/2016  . 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 Diabetes mellitus ([place]) 08/09/2015  . Arthritis of elbow, left 03/23/2015  . Laryngopharyngeal reflux 06/06/2014  . Other tenosynovitis of hand and wrist 02/28/2014  . Primary localized osteoarthrosis of forearm 02/28/2014  . Shortness of breath 10/07/2012  . Keratoconjunctivitis sicca not due to Sjogren's syndrome 09/17/2011  . Malignant neoplasm of female breast ([place]) 09/17/2011  . Sjogren's syndrome ([place]) 09/17/2011  . OBSTRUCTIVE SLEEP APNEA 06/03/2007  . Essential hypertension 06/03/2007  . GERD 06/03/2007  . COUGH 06/03/2007    Past Surgical History:  Procedure Laterality Date  . BREAST LUMPECTOMY Left 2007  . ROTATOR CUFF REPAIR      Current Outpatient Medications on File Prior to Visit  Medication Sig Dispense Refill  . albuterol (PROVENTIL HFA;VENTOLIN HFA) 108 (90 Base) MCG/ACT inhaler Inhale 1-2 puffs into the lungs every 4 (four) hours as needed for wheezing or shortness of breath.     . allopurinol (ZYLOPRIM) 100 MG tablet Take 100 mg by mouth daily.     . Ascorbic Acid (VITAMIN C) 1000 MG tablet Take 1,000 mg by mouth daily.    Marland Kitchen aspirin EC 81 MG tablet Take 81 mg by mouth daily.    Marland Kitchen atorvastatin (  LIPITOR) 10 MG tablet Take 5 mg by mouth daily.  11  . b complex vitamins capsule Take 1 capsule by mouth daily.    . brimonidine (ALPHAGAN) 0.2 % ophthalmic solution Place 1 drop into the right eye 3 (three) times  daily.     . Calcium Carbonate-Vitamin D (OYSTER SHELL CALCIUM/D) 250-125 MG-UNIT TABS Take by mouth.    . Calcium-Magnesium-Vitamin D (CALCIUM MAGNESIUM PO) Take 1 tablet by mouth daily. Plus [id number] mg-unit     . Cyanocobalamin (VITAMIN B-12 PO) Take 1 tablet by mouth daily.     Marland Kitchen denosumab (PROLIA) 60 MG/ML SOSY injection Inject into the skin.    . DULoxetine (CYMBALTA) 60 MG capsule Take 60 mg by mouth daily.    . fexofenadine (ALLEGRA) 180 MG tablet Take 180 mg by mouth daily.     . Flaxseed, Linseed, (FLAXSEED OIL) 1000 MG CAPS Take 1 capsule by mouth daily.     . fluconazole (DIFLUCAN) 100 MG tablet Take by mouth.     . fluticasone (FLONASE) 50 MCG/ACT nasal spray Place into the nose.    . furosemide (LASIX) 20 MG tablet Take 1 tablet (20 mg total) by mouth daily. Restart in  1 week    . gabapentin (NEURONTIN) 800 MG tablet Take 800 mg by mouth 5 (five) times daily.    Marland Kitchen glimepiride (AMARYL) 4 MG tablet Take 4 mg by mouth daily with breakfast.    . glucose blood (ONETOUCH VERIO) test strip USE TO CHECK BLOOD SUGAR EVERY DAY    . ketorolac (ACULAR) 0.5 % ophthalmic solution Place 1 drop into the left eye 4 (four) times daily.    Marland Kitchen leflunomide (ARAVA) 20 MG tablet Take 20 mg by mouth daily.    Marland Kitchen losartan-hydrochlorothiazide (HYZAAR) 100-12.5 MG tablet Take 1 tablet by mouth at bedtime.  2  . metFORMIN (GLUCOPHAGE-XR) 500 MG 24 hr tablet Take 1 tablet (500 mg total) by mouth 2 (two) times daily. Restart in 1 week    . metoprolol succinate (TOPROL-XL) 50 MG 24 hr tablet Take 50 mg by mouth daily.     . montelukast (SINGULAIR) 10 MG tablet Take 10 mg by mouth at bedtime.     . Multiple Vitamin (MULTIVITAMIN) capsule Take 1 capsule by mouth daily.     . NONFORMULARY OR COMPOUNDED ITEM Apply 1-2 g topically 4 (four) times daily. [facility]  Peripheral Neuropathy Cream- Bupivacaine 1%, Doxepin 3%, Gabapentin 6%, Pentoxifylline 3%, Topiramate 1% Apply 1-2 grams to affected area 3-4  times daily Qty. 120 gm 3 refills 120 each 2  . Omega-3 1000 MG CAPS Take by mouth.    Marland Kitchen omeprazole (PRILOSEC) 20 MG capsule Take 40 mg by mouth 2 (two) times daily.  5  . OXcarbazepine (TRILEPTAL) 150 MG tablet Take 150 mg by mouth 2 (two) times daily.    . potassium chloride (K-DUR) 10 MEQ tablet Take 10 mEq by mouth 2 (two) times daily.    . Potassium Gluconate 2.5 MEQ TABS Take by mouth.    . prednisoLONE acetate (PRED FORTE) 1 % ophthalmic suspension Place 1 drop into the left eye 4 (four) times daily.    . predniSONE (DELTASONE) 10 MG tablet Take by mouth.    . sodium chloride (OCEAN NASAL SPRAY) 0.65 % nasal spray Place 1 spray into the nose as needed for congestion.    . valACYclovir (VALTREX) 1000 MG tablet Take 1,000 mg by mouth 3 (three) times daily.    . vitamin E  100 UNIT capsule Take 100 Units by mouth daily.     No current facility-administered medications on file prior to visit.     Allergies  Allergen Reactions  . Azithromycin Hives and Rash  . Ciprofloxacin Hives and Rash  . Dapagliflozin Rash and Hives  . Hydrocodone-Acetaminophen Hives and Rash  . Levofloxacin Hives and Rash    Social History   Occupational History  . Not on file  Tobacco Use  . Smoking status: Former Research scientist (life sciences)  . Smokeless tobacco: Never Used  Substance and Sexual Activity  . Alcohol use: Not Currently  . Drug use: Never  . Sexual activity: Not on file    Family History  Problem Relation Age of Onset  . Pneumonia Other        Died of Coronavirus 09-30-18  . Hypertension Mother   . Diabetes Mother   . Stroke Mother   . Emphysema Mother   . Hypertension Father   . Emphysema Father   . Heart disease Father   . Diabetes Sister   . Osteoporosis Sister   . Emphysema Sister     Immunization History  Administered Date(s) Administered  . Influenza, High Dose Seasonal PF 04/05/2015, 03/29/2016, 03/26/2017, 04/08/2018  . Pneumococcal Conjugate-13 12/28/2013  . Pneumococcal  Polysaccharide-23 05/03/2002  . Tdap 11/20/2010     Objective: There were no vitals filed for this visit.  Megan Golden is a pleasant 74 y.o. Caucasian female in NAD. AAO X 3.  Vascular Examination: Neurovascular status unchanged b/l lower extremities. Capillary refill time to digits immediate b/l. Palpable pedal pulses b/l LE. Pedal hair sparse. Lower extremity skin temperature gradient within normal limits. No pain with calf compression b/l. No edema noted b/l lower extremities.  Dermatological Examination: Pedal skin with normal turgor, texture and tone bilaterally. Healing abrasion noted dorsal aspect right 2nd digit.  No interdigital macerations bilaterally. Toenails 1-5 b/l elongated, discolored, dystrophic, thickened, crumbly with subungual debris and tenderness to dorsal palpation.    Musculoskeletal Examination: Normal muscle strength 5/5 to all lower extremity muscle groups bilaterally. No pain crepitus or joint limitation noted with ROM b/l. Hallux valgus with bunion deformity noted b/l lower extremities. Hammertoes noted to the b/l lower extremities.  Footwear Assessment: Does the patient wear appropriate shoes? Yes.  Neurological Examination: Protective sensation diminished with 10g monofilament b/l. Vibratory sensation decreased b/l. Clonus negative b/l.  Assessment: 1. Pain due to onychomycosis of toenails of both feet   2. Abrasion of second toe of right foot, initial encounter   3. Acquired hallux valgus of both feet   4. Hammer toes of both feet   5. Diabetic neuropathy, painful ([place])   6. Encounter for diabetic foot exam ([place])      ADA Risk Categorization: High Risk  Patient has one or more of the following: Loss of protective sensation Absent pedal pulses Severe Foot deformity History of foot ulcer  Plan: -Examined patient. -Diabetic foot examination performed on today's visit. -Toenails 1-5 b/l were debrided in length and girth with sterile nail  nippers and dremel without iatrogenic bleeding.  -Patient to report any pedal injuries to medical professional immediately. -For right 2nd digit abrasion, she was instructed to apply antibiotic ointment to digit once daily until healed. Call office if condition does not improve or worsens. -Patient to continue soft, supportive shoe gear daily. -Patient/POA to call should there be question/concern in the interim.  Return in about 3 months (around 07/08/2020).  Marzetta Board, DPM

## 2020-04-12 ENCOUNTER — Other Ambulatory Visit: Payer: Self-pay

## 2020-04-12 ENCOUNTER — Encounter (INDEPENDENT_AMBULATORY_CARE_PROVIDER_SITE_OTHER): Payer: Medicare Other | Admitting: Ophthalmology

## 2020-04-12 DIAGNOSIS — H35033 Hypertensive retinopathy, bilateral: Secondary | ICD-10-CM | POA: Diagnosis not present

## 2020-04-12 DIAGNOSIS — H43813 Vitreous degeneration, bilateral: Secondary | ICD-10-CM

## 2020-04-12 DIAGNOSIS — H35372 Puckering of macula, left eye: Secondary | ICD-10-CM

## 2020-04-12 DIAGNOSIS — H59032 Cystoid macular edema following cataract surgery, left eye: Secondary | ICD-10-CM

## 2020-04-12 DIAGNOSIS — I10 Essential (primary) hypertension: Secondary | ICD-10-CM | POA: Diagnosis not present

## 2020-07-11 ENCOUNTER — Ambulatory Visit (INDEPENDENT_AMBULATORY_CARE_PROVIDER_SITE_OTHER): Payer: Medicare Other | Admitting: Podiatry

## 2020-07-11 ENCOUNTER — Other Ambulatory Visit: Payer: Self-pay

## 2020-07-11 ENCOUNTER — Encounter: Payer: Self-pay | Admitting: Podiatry

## 2020-07-11 DIAGNOSIS — M79674 Pain in right toe(s): Secondary | ICD-10-CM

## 2020-07-11 DIAGNOSIS — M2011 Hallux valgus (acquired), right foot: Secondary | ICD-10-CM

## 2020-07-11 DIAGNOSIS — B351 Tinea unguium: Secondary | ICD-10-CM | POA: Diagnosis not present

## 2020-07-11 DIAGNOSIS — M2041 Other hammer toe(s) (acquired), right foot: Secondary | ICD-10-CM

## 2020-07-11 DIAGNOSIS — E1140 Type 2 diabetes mellitus with diabetic neuropathy, unspecified: Secondary | ICD-10-CM | POA: Diagnosis not present

## 2020-07-11 DIAGNOSIS — M79675 Pain in left toe(s): Secondary | ICD-10-CM | POA: Diagnosis not present

## 2020-07-11 DIAGNOSIS — M2042 Other hammer toe(s) (acquired), left foot: Secondary | ICD-10-CM

## 2020-07-11 DIAGNOSIS — G629 Polyneuropathy, unspecified: Secondary | ICD-10-CM | POA: Insufficient documentation

## 2020-07-11 DIAGNOSIS — M2012 Hallux valgus (acquired), left foot: Secondary | ICD-10-CM

## 2020-07-14 NOTE — Progress Notes (Addendum)
Subjective: Megan Golden presents today for at risk foot care with history of diabetic neuropathy and painful thick toenails that are difficult to trim. Pain interferes with ambulation. Aggravating factors include wearing enclosed shoe gear. Pain is relieved with periodic professional debridement.  She states her blood glucose was 167 mg/dl this morning. She will be seeing a new Neurologist for her neuropathy symptoms.  She states both great toes are sore today. She denies any redness, drainage or swelling.  PCP is Emeline General, NP. Last visit was 02/14/2020.  Past Medical History:  Diagnosis Date  . Asthma   . DM2 (diabetes mellitus, type 2) ([facility])   . GERD (gastroesophageal reflux disease)   . HTN (hypertension)   . Pulmonary nodule   . RA (rheumatoid arthritis) ([facility])    RF positive    Patient Active Problem List   Diagnosis Date Noted  . Neuropathy 07/11/2020  . Osteoporosis 01/17/2020  . Microalbuminuria 12/22/2019  . Hand pain 10/18/2019  . Skin sensation disturbance 10/18/2019  . Diarrhea 02/11/2019  . Globus sensation 02/11/2019  . Nuclear sclerotic cataract of right eye 02/08/2019  . Aortic atherosclerosis ([facility]) 10/07/2018  . Chronic diastolic heart failure ([facility]) 10/07/2018  . History of 2019 novel coronavirus disease (COVID-19) 10/07/2018  . Hypertriglyceridemia 10/07/2018  . Immunosuppression ([facility]) 10/07/2018  . Acute respiratory disease due to COVID-19 virus 09/29/2018  . Rheumatoid arthritis ([facility]) 09/29/2018  . Diabetic peripheral neuropathy ([facility]) 02/10/2018  . Posterior vitreous detachment, bilateral 12/29/2017  . Vitreous floaters, bilateral 12/29/2017  . Hyperuricemia 11/11/2017  . Rheumatoid arthritis involving both hands with positive rheumatoid factor ([facility]) 11/11/2017  . Osteoarthritis of cervical spine 10/23/2017  . Localized edema 07/04/2017  . Bilateral lower extremity edema 07/04/2017  . Dysfunction of both eustachian tubes 05/12/2017  . Oral  thrush 05/12/2017  . Arthritis of hand, right 02/19/2017  . Pulmonary nodule 12/24/2016  . Type 2 diabetes mellitus without retinopathy ([facility]CC) 12/18/2016  . Age-related osteoporosis without current pathological fracture 11/15/2016  . Chronic bilateral back pain 11/07/2016  . History of breast cancer 08/09/2016  . Moderate persistent asthma with acute exacerbation 08/09/2016  . Aftercare following surgery of the musculoskeletal system 07/14/2016  . Bunion 02/19/2016  . Allergic rhinitis 02/08/2016  . 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 Diabetes mellitus ([facility]) 08/09/2015  . Arthritis of elbow, left 03/23/2015  . Laryngopharyngeal reflux 06/06/2014  . Other tenosynovitis of hand and wrist 02/28/2014  . Primary localized osteoarthrosis of forearm 02/28/2014  . Shortness of breath 10/07/2012  . Keratoconjunctivitis sicca not due to Sjogren's syndrome 09/17/2011  . Malignant neoplasm of female breast ([facility]) 09/17/2011  . Sjogren's syndrome ([facility]) 09/17/2011  . OBSTRUCTIVE SLEEP APNEA 06/03/2007  . Essential hypertension 06/03/2007  . GERD 06/03/2007  . COUGH 06/03/2007    Past Surgical History:  Procedure Laterality Date  . BREAST LUMPECTOMY Left 2007  . ROTATOR CUFF REPAIR      Current Outpatient Medications on File Prior to Visit  Medication Sig Dispense Refill  . atropine 1 % ophthalmic solution Place 1 drop into the left eye 2 times daily.    . dorzolamide-timolol (COSOPT) 22.3-6.8 MG/ML ophthalmic solution Place 1 drop into the left eye 2 times daily for 30 days.    . insulin degludec (TRESIBA) 100 UNIT/ML FlexTouch Pen Inject 10 units once a day with additional as needed.  MDD: 50 units/day    . Insulin Pen Needle (PEN NEEDLES 31GX5/16") 31G X 8 MM MISC  Use as directed for 1 injection daily    . Lifitegrast (XIIDRA) 5 % SOLN Place 1 drop into both eyes 2 times daily.    Marland Kitchen trimethoprim-polymyxin b (POLYTRIM) ophthalmic solution Place 1 drop  into the left eye 4 times daily.    Marland Kitchen albuterol (PROVENTIL HFA;VENTOLIN HFA) 108 (90 Base) MCG/ACT inhaler Inhale 1-2 puffs into the lungs every 4 (four) hours as needed for wheezing or shortness of breath.     . allopurinol (ZYLOPRIM) 100 MG tablet Take 100 mg by mouth daily.     . Ascorbic Acid (VITAMIN C) 1000 MG tablet Take 1,000 mg by mouth daily.    . Ascorbic Acid (VITAMIN C) 1000 MG tablet 1 tablet    . aspirin EC 81 MG tablet Take 81 mg by mouth daily.    Marland Kitchen atorvastatin (LIPITOR) 10 MG tablet Take 5 mg by mouth daily.  11  . Azelastine HCl 137 MCG/SPRAY SOLN 1 puff in each nostril    . b complex vitamins capsule Take 1 capsule by mouth daily.    . Bee Pollen 580 MG CAPS     . benzonatate (TESSALON) 100 MG capsule Take by mouth.    . brimonidine (ALPHAGAN) 0.2 % ophthalmic solution Place 1 drop into the right eye 3 (three) times daily.     . budesonide-formoterol (SYMBICORT) 160-4.5 MCG/ACT inhaler 2 puffs    . Calcium Carbonate-Vitamin D (OYSTER SHELL CALCIUM/D) 250-125 MG-UNIT TABS Take by mouth.    . Calcium Gluconate 50 MG TABS 1 tab    . Calcium-Magnesium-Vitamin D (CALCIUM MAGNESIUM PO) Take 1 tablet by mouth daily. Plus [id number] mg-unit     . Calcium-Magnesium-Vitamin D 500-250-125 MG-MG-UNIT TABS 1 tablet    . Cyanocobalamin (VITAMIN B-12 PO) Take 1 tablet by mouth daily.     Marland Kitchen denosumab (PROLIA) 60 MG/ML SOSY injection Inject into the skin.    Marland Kitchen doxycycline (VIBRAMYCIN) 100 MG capsule Take 100 mg by mouth 2 (two) times daily.    . DULoxetine (CYMBALTA) 60 MG capsule Take 60 mg by mouth daily.    . fexofenadine (ALLEGRA) 180 MG tablet Take 180 mg by mouth daily.     . Flaxseed, Linseed, (FLAXSEED OIL) 1000 MG CAPS Take 1 capsule by mouth daily.     . fluconazole (DIFLUCAN) 100 MG tablet Take by mouth.     . fluorometholone (FML) 0.1 % ophthalmic suspension Place into the left eye.    . fluticasone (FLONASE) 50 MCG/ACT nasal spray Place into the nose.    . furosemide  (LASIX) 20 MG tablet Take 1 tablet (20 mg total) by mouth daily. Restart in  1 week    . gabapentin (NEURONTIN) 800 MG tablet Take 800 mg by mouth 5 (five) times daily.    Marland Kitchen glimepiride (AMARYL) 4 MG tablet Take 4 mg by mouth daily with breakfast.    . glucose blood (ONETOUCH VERIO) test strip USE TO CHECK BLOOD SUGAR EVERY DAY    . JARDIANCE 10 MG TABS tablet Take 10 mg by mouth daily.    Marland Kitchen ketorolac (ACULAR) 0.5 % ophthalmic solution Place 1 drop into the left eye 4 (four) times daily.    Marland Kitchen leflunomide (ARAVA) 20 MG tablet Take 20 mg by mouth daily.    Marland Kitchen losartan-hydrochlorothiazide (HYZAAR) 100-12.5 MG tablet Take 1 tablet by mouth at bedtime.  2  . metFORMIN (GLUCOPHAGE-XR) 500 MG 24 hr tablet Take 1 tablet (500 mg total) by mouth 2 (two) times daily. Restart in 1  week    . metoprolol succinate (TOPROL-XL) 50 MG 24 hr tablet Take 50 mg by mouth daily.     . mometasone-formoterol (DULERA) 100-5 MCG/ACT AERO 2 puffs    . montelukast (SINGULAIR) 10 MG tablet Take 10 mg by mouth at bedtime.     . Multiple Vitamin (MULTIVITAMIN) capsule Take 1 capsule by mouth daily.     . NONFORMULARY OR COMPOUNDED ITEM Apply 1-2 g topically 4 (four) times daily. [facility]  Peripheral Neuropathy Cream- Bupivacaine 1%, Doxepin 3%, Gabapentin 6%, Pentoxifylline 3%, Topiramate 1% Apply 1-2 grams to affected area 3-4 times daily Qty. 120 gm 3 refills 120 each 2  . Omega-3 1000 MG CAPS Take by mouth.    . omega-3 acid ethyl esters (LOVAZA) 1 g capsule 2 capsules    . omeprazole (PRILOSEC) 20 MG capsule Take 40 mg by mouth 2 (two) times daily.  5  . OXcarbazepine (TRILEPTAL) 150 MG tablet Take 150 mg by mouth 2 (two) times daily.    . potassium chloride (K-DUR) 10 MEQ tablet Take 10 mEq by mouth 2 (two) times daily.    . Potassium Gluconate 2.5 MEQ TABS Take by mouth.    . prednisoLONE acetate (PRED FORTE) 1 % ophthalmic suspension Place 1 drop into the left eye 4 (four) times daily.    . predniSONE  (DELTASONE) 10 MG tablet Take by mouth.    . RYBELSUS 3 MG TABS Take 1 tablet by mouth daily.    . sodium chloride (OCEAN NASAL SPRAY) 0.65 % nasal spray Place 1 spray into the nose as needed for congestion.    . valACYclovir (VALTREX) 1000 MG tablet Take 1,000 mg by mouth 3 (three) times daily.    . vitamin E 100 UNIT capsule Take 100 Units by mouth daily.     No current facility-administered medications on file prior to visit.     Allergies  Allergen Reactions  . Azithromycin Hives and Rash  . Ciprofloxacin Hives and Rash  . Dapagliflozin Rash and Hives  . Hydrocodone-Acetaminophen Hives and Rash  . Levofloxacin Hives and Rash    Social History   Occupational History  . Not on file  Tobacco Use  . Smoking status: Former Research scientist (life sciences)  . Smokeless tobacco: Never Used  Substance and Sexual Activity  . Alcohol use: Not Currently  . Drug use: Never  . Sexual activity: Not on file    Family History  Problem Relation Age of Onset  . Pneumonia Other        Died of Coronavirus Oct 06, 2018  . Hypertension Mother   . Diabetes Mother   . Stroke Mother   . Emphysema Mother   . Hypertension Father   . Emphysema Father   . Heart disease Father   . Diabetes Sister   . Osteoporosis Sister   . Emphysema Sister     Immunization History  Administered Date(s) Administered  . Influenza, High Dose Seasonal PF 04/05/2015, 03/29/2016, 03/26/2017, 04/08/2018  . Moderna Sars-Covid-2 Vaccination 08/26/2019, 09/23/2019  . Pneumococcal Conjugate-13 12/28/2013  . Pneumococcal Polysaccharide-23 05/03/2002  . Tdap 11/20/2010     Objective: There were no vitals filed for this visit.  Megan Golden is a pleasant 75 y.o. Caucasian female in NAD. AAO X 3.  Vascular Examination: Neurovascular status unchanged b/l lower extremities. Capillary refill time to digits immediate b/l. Palpable pedal pulses b/l LE. Pedal hair sparse. Lower extremity skin temperature gradient within normal limits. No pain  with calf compression b/l. No edema noted  b/l lower extremities.  Dermatological Examination: Pedal skin with normal turgor, texture and tone bilaterally.  No interdigital macerations bilaterally. Toenails 1-5 b/l elongated, discolored, dystrophic, thickened, crumbly with subungual debris and tenderness to dorsal palpation.   Musculoskeletal Examination: Normal muscle strength 5/5 to all lower extremity muscle groups bilaterally. No pain crepitus or joint limitation noted with ROM b/l. Hallux valgus with bunion deformity noted b/l lower extremities. Hammertoes noted to the b/l lower extremities.  Neurological Examination: Protective sensation diminished with 10g monofilament b/l. Vibratory sensation decreased b/l. Clonus negative b/l.  Assessment: 1. Pain due to onychomycosis of toenails of both feet   2. Acquired hallux valgus of both feet   3. Hammer toes of both feet   4. Diabetic neuropathy, painful ([place])    Plan: -Examined patient. -Continue diabetic foot care principles. -Toenails 1-5 b/l were debrided in length and girth with sterile nail nippers and dremel without iatrogenic bleeding.  -Patient to report any pedal injuries to medical professional immediately. -Patient to continue soft, supportive shoe gear daily. -Patient/POA to call should there be question/concern in the interim.  Return in about 3 months (around 10/09/2020).  Marzetta Board, DPM

## 2020-10-03 ENCOUNTER — Other Ambulatory Visit: Payer: Self-pay

## 2020-10-03 ENCOUNTER — Encounter: Payer: Self-pay | Admitting: Podiatry

## 2020-10-03 ENCOUNTER — Ambulatory Visit (INDEPENDENT_AMBULATORY_CARE_PROVIDER_SITE_OTHER): Payer: Medicare Other | Admitting: Podiatry

## 2020-10-03 DIAGNOSIS — B351 Tinea unguium: Secondary | ICD-10-CM | POA: Diagnosis not present

## 2020-10-03 DIAGNOSIS — M2042 Other hammer toe(s) (acquired), left foot: Secondary | ICD-10-CM

## 2020-10-03 DIAGNOSIS — M2011 Hallux valgus (acquired), right foot: Secondary | ICD-10-CM

## 2020-10-03 DIAGNOSIS — M79675 Pain in left toe(s): Secondary | ICD-10-CM | POA: Diagnosis not present

## 2020-10-03 DIAGNOSIS — M2041 Other hammer toe(s) (acquired), right foot: Secondary | ICD-10-CM

## 2020-10-03 DIAGNOSIS — M79674 Pain in right toe(s): Secondary | ICD-10-CM | POA: Diagnosis not present

## 2020-10-03 DIAGNOSIS — E1140 Type 2 diabetes mellitus with diabetic neuropathy, unspecified: Secondary | ICD-10-CM

## 2020-10-03 DIAGNOSIS — M2012 Hallux valgus (acquired), left foot: Secondary | ICD-10-CM

## 2020-10-03 NOTE — Patient Instructions (Signed)
Recommended Footwear:  [email or web address]  Styles: 1.  Women's Step-In 2.  Soft Sole Bootee 3.  Women's Hannah 4.  Women's [employer]

## 2020-10-08 NOTE — Progress Notes (Signed)
Subjective: [name] presents today for at risk foot care with history of diabetic neuropathy and painful thick toenails that are difficult to trim. Pain interferes with ambulation. Aggravating factors include wearing enclosed shoe gear. Pain is relieved with periodic professional debridement.  She states her blood glucose was 114 mg/dl this morning. She has seen Neurology and is currently being worked up for her neuropathy. She has upcoming testing scheduled in May.  She states both great toes are sore today. She denies any redness, drainage or swelling.  She states she cannot find any comfortable shoes to wear indoors.   PCP is Megan Numbers, NP. Last visit was 02/14/2020. Next visit is 11/24/2020.  Allergies  Allergen Reactions  . Azithromycin Hives and Rash  . Ciprofloxacin Hives and Rash  . Dapagliflozin Rash and Hives  . Hydrocodone-Acetaminophen Hives and Rash  . Levofloxacin Hives and Rash  . Other     Other reaction(s): Rash  . Semaglutide Nausea And Vomiting  . Empagliflozin Rash   Objective: There were no vitals filed for this visit.  Megan Golden is a pleasant 75 y.o. Caucasian female in NAD. AAO X 3.  Vascular Examination: Neurovascular status unchanged b/l lower extremities. Capillary refill time to digits immediate b/l. Palpable pedal pulses b/l LE. Pedal hair sparse. Lower extremity skin temperature gradient within normal limits. No pain with calf compression b/l. No edema noted b/l lower extremities.  Dermatological Examination: Pedal skin with normal turgor, texture and tone bilaterally.  No interdigital macerations bilaterally. Toenails 1-5 b/l elongated, discolored, dystrophic, thickened, crumbly with subungual debris and tenderness to dorsal palpation.   Musculoskeletal Examination: Normal muscle strength 5/5 to all lower extremity muscle groups bilaterally. No pain crepitus or joint limitation noted with ROM b/l. Hallux valgus with bunion deformity noted  b/l lower extremities. Hammertoes noted to the b/l lower extremities.  Neurological Examination: Pt has subjective symptoms of neuropathy. Protective sensation diminished with 10g monofilament b/l. Vibratory sensation decreased b/l. Clonus negative b/l.  Assessment: 1. Pain due to onychomycosis of toenails of both feet   2. Acquired hallux valgus of both feet   3. Hammer toes of both feet   4. Diabetic neuropathy, painful ([place])    Plan: -Examined patient. -Currently seeing Neurology for work-up of painful neuropathy symptoms in feet. -Continue diabetic foot care principles. -Toenails 1-5 b/l were debrided in length and girth with sterile nail nippers and dremel without iatrogenic bleeding. Offending nail borders debrided and curretaged L hallux and R hallux. Borders cleansed with alcohol. Antibiotic ointment applied. No further treatment required by patient. -Patient to report any pedal injuries to medical professional immediately. -Recommended Old Friend Footwear: at [email or web address] (Styles: Women's Step In, Soft Sole Bootee, Women's Hannah and [employer]). -Patient to continue soft, supportive shoe gear daily. -Patient/POA to call should there be question/concern in the interim.  Return in about 3 months (around 01/02/2021).  Marzetta Board, DPM

## 2021-01-17 ENCOUNTER — Other Ambulatory Visit: Payer: Self-pay

## 2021-01-17 ENCOUNTER — Encounter: Payer: Self-pay | Admitting: Podiatry

## 2021-01-17 ENCOUNTER — Ambulatory Visit (INDEPENDENT_AMBULATORY_CARE_PROVIDER_SITE_OTHER): Payer: Medicare Other | Admitting: Podiatry

## 2021-01-17 DIAGNOSIS — E1140 Type 2 diabetes mellitus with diabetic neuropathy, unspecified: Secondary | ICD-10-CM

## 2021-01-17 DIAGNOSIS — M79675 Pain in left toe(s): Secondary | ICD-10-CM | POA: Diagnosis not present

## 2021-01-17 DIAGNOSIS — M79674 Pain in right toe(s): Secondary | ICD-10-CM

## 2021-01-17 DIAGNOSIS — L600 Ingrowing nail: Secondary | ICD-10-CM

## 2021-01-17 DIAGNOSIS — B351 Tinea unguium: Secondary | ICD-10-CM

## 2021-01-17 NOTE — Progress Notes (Signed)
  Subjective:  Patient ID: Megan Golden, female    DOB: 07-Aug-1945,  MRN: [id number]  Megan Golden presents to clinic today for at risk foot care with history of diabetic neuropathy and painful thick toenails that are difficult to trim. Pain interferes with ambulation. Aggravating factors include wearing enclosed shoe gear. Pain is relieved with periodic professional debridement.  Patient states blood glucose was 95 mg/dl today.  PCP is Aleen Campi, NP , and last visit was 11/30/2020.  [name] states she did see Neurology and had testing done which confirms her polyneuropathy. She states she now takes Tramadol and it helps a little.  She states her great toes are tender due to regrowth of ingrown toenails. Denies any redness, drainage or swelling of digits.  Allergies  Allergen Reactions   Azithromycin Golden and Rash   Ciprofloxacin Golden and Rash   Dapagliflozin Rash and Golden   Hydrocodone-Acetaminophen Golden and Rash   Levofloxacin Golden and Rash   Other     Other reaction(s): Rash   Semaglutide Nausea And Vomiting   Empagliflozin Rash    Review of Systems: Negative except as noted in the HPI. Objective:   Constitutional Megan Golden is a pleasant 75 y.o. Caucasian female, in NAD. AAO x 3.   Vascular Capillary refill time to digits immediate b/l. Palpable DP pulse(s) b/l lower extremities Palpable PT pulse(s) b/l lower extremities Pedal hair sparse. Lower extremity skin temperature gradient within normal limits. No pain with calf compression b/l. No edema noted b/l lower extremities. No cyanosis or clubbing noted.  Neurologic Normal speech. Oriented to person, place, and time. Pt has subjective symptoms of neuropathy. Protective sensation diminished with 10g monofilament b/l. Vibratory sensation decreased b/l.  Dermatologic Pedal skin with normal turgor, texture and tone b/l lower extremities. No open wounds b/l lower extremities. No interdigital macerations b/l lower  extremities. Toenails 1-5 b/l elongated, discolored, dystrophic, thickened, crumbly with subungual debris and tenderness to dorsal palpation. Incurvated nailplate bilateral border(s) L hallux and R hallux.  Nail border hypertrophy minimal. There is tenderness to palpation. Sign(s) of infection: no clinical signs of infection noted on examination today..  Orthopedic: Normal muscle strength 5/5 to all lower extremity muscle groups bilaterally. No pain crepitus or joint limitation noted with ROM b/l. Hallux valgus with bunion deformity noted b/l lower extremities. Hammertoe(s) noted to the lesser digits.   Radiographs: None Assessment:   1. Pain due to onychomycosis of toenails of both feet   2. Ingrown nail   3. Diabetic neuropathy, painful ([place])    Plan:  -She has followed up with neuropathy and polyneuropathy has been confirmed by Neurology. Now on Tramadol for symptoms. -Continue diabetic foot care principles. -Patient to continue soft, supportive shoe gear daily. -Toenails 1-5 b/l were debrided in length and girth with sterile nail nippers and dremel without iatrogenic bleeding.  -Offending nail border debrided and curretaged L hallux and R hallux utilizing sterile nail nipper and currette. Border cleansed with alcohol and triple antibiotic applied. No further treatment required by patient/caregiver. -Patient to report any pedal injuries to medical professional immediately. -Patient/POA to call should there be question/concern in the interim.  Return in about 3 months (around 04/19/2021).  Marzetta Board, DPM

## 2021-02-05 ENCOUNTER — Other Ambulatory Visit: Payer: Self-pay

## 2021-02-05 ENCOUNTER — Ambulatory Visit (INDEPENDENT_AMBULATORY_CARE_PROVIDER_SITE_OTHER): Payer: Medicare Other | Admitting: Family Medicine

## 2021-02-05 ENCOUNTER — Encounter: Payer: Self-pay | Admitting: Family Medicine

## 2021-02-05 DIAGNOSIS — M5441 Lumbago with sciatica, right side: Secondary | ICD-10-CM | POA: Diagnosis not present

## 2021-02-05 MED ORDER — BACLOFEN 10 MG PO TABS: 5.0000 mg | ORAL_TABLET | Freq: Three times a day (TID) | ORAL | 3 refills | Status: AC | PRN

## 2021-02-05 MED ORDER — TRAMADOL HCL 50 MG PO TABS: 50.0000 mg | ORAL_TABLET | Freq: Four times a day (QID) | ORAL | 0 refills | Status: AC | PRN

## 2021-02-05 NOTE — Progress Notes (Signed)
Office Visit Note   Patient: Megan Golden           Date of Birth: September 27, 1945           MRN: [id number] Visit Date: 02/05/2021 Requested by: Aleen Campi, NP [id number] [place],  [place] [address] PCP: Aleen Campi, NP  Subjective: Chief Complaint  Patient presents with   Lower Back - Pain    Pain in the right lower back, radiating down the right thigh (inner and anterior). Soreness in the thigh, when touched. This started 1 week ago, when she started to sit down in a chair in the kitchen -- felt a pop in the back -- has been hurting since.     HPI: She is here with worsening low back pain.  I saw her a year ago and referred her to physical therapy.  It helped with her acute pain, but she has continued to have intermittent troubles with her back.  Then about a week ago she felt something pop and has had severe pain since then.  The pop felt like it occurred in the right posterior hip and now there is pain radiating down the back of the leg and into the groin area.  Sitting is the most comfortable, lying down is the worst.                ROS:   All other systems were reviewed and are negative.  Objective: Vital Signs: There were no vitals taken for this visit.  Physical Exam:  General:  Alert and oriented, in no acute distress. Pulm:  Breathing unlabored. Psy:  Normal mood, congruent affect. Skin: No visible rash Low back: No tenderness over the spinous processes.  There is moderate tenderness in the sciatic notch.  No pain with internal hip rotation.  Straight leg raise is equivocal.  Lower extremity strength and reflexes remain normal.    Imaging: No results found.  Assessment & Plan: Acute on chronic right-sided low back pain, concerning for lumbar disc protrusion -Baclofen and tramadol as needed.  MRI ordered.     Procedures: No procedures performed        PMFS History: Patient Active Problem List   Diagnosis Date Noted   Neuropathy  07/11/2020   Osteoporosis 01/17/2020   Microalbuminuria 12/22/2019   Hand pain 10/18/2019   Skin sensation disturbance 10/18/2019   Diarrhea 02/11/2019   Globus sensation 02/11/2019   Nuclear sclerotic cataract of right eye 02/08/2019   Aortic atherosclerosis ([place]) 10/07/2018   Chronic diastolic heart failure ([place]) 10/07/2018   History of 2019 novel coronavirus disease (COVID-19) 10/07/2018   Hypertriglyceridemia 10/07/2018   Immunosuppression ([place]) 10/07/2018   Acute respiratory disease due to COVID-19 virus 09/29/2018   Rheumatoid arthritis ([place]) 09/29/2018   Diabetic peripheral neuropathy ([place]) 02/10/2018   Posterior vitreous detachment, bilateral 12/29/2017   Vitreous floaters, bilateral 12/29/2017   Hyperuricemia 11/11/2017   Rheumatoid arthritis involving both hands with positive rheumatoid factor ([place]) 11/11/2017   Osteoarthritis of cervical spine 10/23/2017   Localized edema 07/04/2017   Bilateral lower extremity edema 07/04/2017   Dysfunction of both eustachian tubes 05/12/2017   Oral thrush 05/12/2017   Arthritis of hand, right 02/19/2017   Pulmonary nodule 12/24/2016   Type 2 diabetes mellitus without retinopathy ([place]) 12/18/2016   Age-related osteoporosis without current pathological fracture 11/15/2016   Chronic bilateral back pain 11/07/2016   History of breast cancer 08/09/2016   Moderate persistent asthma with acute exacerbation  08/09/2016   Aftercare following surgery of the musculoskeletal system 07/14/2016   Bunion 02/19/2016   Allergic rhinitis 02/08/2016   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 01/17/2016   Diabetes mellitus ([place]) 08/09/2015   Arthritis of elbow, left 03/23/2015   Laryngopharyngeal reflux 06/06/2014   Other tenosynovitis of hand and wrist 02/28/2014   Primary localized osteoarthrosis of forearm 02/28/2014   Shortness of breath 10/07/2012   Keratoconjunctivitis sicca not due to Sjogren's syndrome 09/17/2011    Malignant neoplasm of female breast ([place]) 09/17/2011   Sjogren's syndrome ([place]) 09/17/2011   OBSTRUCTIVE SLEEP APNEA 06/03/2007   Essential hypertension 06/03/2007   GERD 06/03/2007   COUGH 06/03/2007   Past Medical History:  Diagnosis Date   Asthma    DM2 (diabetes mellitus, type 2) ([facility])    GERD (gastroesophageal reflux disease)    HTN (hypertension)    Pulmonary nodule    RA (rheumatoid arthritis) ([place])    RF positive    Family History  Problem Relation Age of Onset   Pneumonia Other        Died of Coronavirus 2018-10-26   Hypertension Mother    Diabetes Mother    Stroke Mother    Emphysema Mother    Hypertension Father    Emphysema Father    Heart disease Father    Diabetes Sister    Osteoporosis Sister    Emphysema Sister     Past Surgical History:  Procedure Laterality Date   BREAST LUMPECTOMY Left 2007   ROTATOR CUFF REPAIR     Social History   Occupational History   Not on file  Tobacco Use   Smoking status: Former   Smokeless tobacco: Never  Substance and Sexual Activity   Alcohol use: Not Currently   Drug use: Never   Sexual activity: Not on file

## 2021-02-18 ENCOUNTER — Ambulatory Visit
Admission: RE | Admit: 2021-02-18 | Discharge: 2021-02-18 | Disposition: A | Discharge status: Home / Self Care | Payer: Medicare Other | Source: Ambulatory Visit | Attending: Family Medicine | Admitting: Family Medicine

## 2021-02-18 ENCOUNTER — Other Ambulatory Visit: Payer: Self-pay

## 2021-02-18 DIAGNOSIS — M5441 Lumbago with sciatica, right side: Secondary | ICD-10-CM

## 2021-02-19 ENCOUNTER — Telehealth: Payer: Self-pay | Admitting: Family Medicine

## 2021-02-19 NOTE — Telephone Encounter (Signed)
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  This is probably the source of pain.  Presuming that the pain is still significant, could contemplate referral for epidural steroid injection.  If that does not help, a brief trial of chiropractic could be considered versus referral to a spine surgeon.

## 2021-02-20 ENCOUNTER — Telehealth: Payer: Self-pay | Admitting: Family Medicine

## 2021-02-20 NOTE — Telephone Encounter (Signed)
I called the patient, leaving a voice message to call me back.

## 2021-02-20 NOTE — Telephone Encounter (Signed)
Pt asking for a call back as she had an Mri completed Sunday. Hilts had a note for Terri to "notify pt". Did not sch an MRI follow up appt at this time, if a follow up is needed please let me know and I can sch it. Did not know if [facility] wanted an appt or just to have the nurse speak to the pt first and see what next steps are. The best call back number for the pt is [phone].

## 2021-02-20 NOTE — Telephone Encounter (Signed)
I called the patient, but reached a voice mail. Message left to call me back -- see other message on this that has the MRI results.

## 2021-02-21 NOTE — Telephone Encounter (Signed)
Sent these results in a MyChart message to the patient.

## 2021-04-10 DIAGNOSIS — N1831 Chronic kidney disease, stage 3a: Secondary | ICD-10-CM | POA: Insufficient documentation

## 2021-04-23 ENCOUNTER — Encounter: Payer: Self-pay | Admitting: Podiatry

## 2021-04-23 ENCOUNTER — Other Ambulatory Visit: Payer: Self-pay

## 2021-04-23 ENCOUNTER — Ambulatory Visit (INDEPENDENT_AMBULATORY_CARE_PROVIDER_SITE_OTHER): Payer: Medicare Other | Admitting: Podiatry

## 2021-04-23 DIAGNOSIS — Z7409 Other reduced mobility: Secondary | ICD-10-CM | POA: Insufficient documentation

## 2021-04-23 DIAGNOSIS — E1140 Type 2 diabetes mellitus with diabetic neuropathy, unspecified: Secondary | ICD-10-CM

## 2021-04-23 DIAGNOSIS — M79675 Pain in left toe(s): Secondary | ICD-10-CM | POA: Diagnosis not present

## 2021-04-23 DIAGNOSIS — B351 Tinea unguium: Secondary | ICD-10-CM

## 2021-04-23 DIAGNOSIS — M79674 Pain in right toe(s): Secondary | ICD-10-CM

## 2021-04-29 NOTE — Progress Notes (Signed)
Subjective: Megan Golden is a 75 y.o. female patient seen today for follow up of  painful thick toenails that are difficult to trim. Pain interferes with ambulation. Aggravating factors include wearing enclosed shoe gear. Pain is relieved with periodic professional debridement.  New problems reported today: None.  Patient states their blood glucose was 195 mg/dl today.   Sadly, patient reports she lost her sister. She states she has now moved to [place], [place], and is looking for a Podiatrist closer to that area.  PCP is Aleen Campi, NP. Last visit was: one month ago.  Allergies  Allergen Reactions   Gabapentin Anaphylaxis    Serotonin Syndrome   Azithromycin Hives and Rash   Ciprofloxacin Hives and Rash   Dapagliflozin Rash and Hives   Hydrocodone-Acetaminophen Hives and Rash   Levofloxacin Hives and Rash   Other     Other reaction(s): Rash   Semaglutide Nausea And Vomiting   Empagliflozin Rash   Nickel Rash    Objective: Physical Exam  General: Patient is a pleasant 75 y.o. Caucasian female WD, WN in NAD. AAO x 3.   Neurovascular Examination: Capillary refill time to digits immediate b/l. Palpable pedal pulses b/l LE. Pedal hair sparse. Lower extremity skin temperature gradient within normal limits. No pain with calf compression RLE. No edema noted b/l lower extremities.  Pt has subjective symptoms of neuropathy. Protective sensation diminished with 10g monofilament b/l. Vibratory sensation decreased b/l.  Dermatological:  Pedal skin is warm and supple b/l LE. No open wounds b/l LE. No interdigital macerations noted b/l LE. Toenails 1-5 b/l elongated, discolored, dystrophic, thickened, crumbly with subungual debris and tenderness to dorsal palpation.  Musculoskeletal:  Normal muscle strength 5/5 to all lower extremity muscle groups bilaterally. HAV with bunion deformity noted b/l LE. Hammertoe deformity noted 2-5 b/l.  Assessment: 1. Pain due to onychomycosis of  toenails of both feet   2. Diabetic neuropathy, painful ([place])    Plan: Patient was evaluated and treated and all questions answered. Consent given for treatment as described below: -No new findings. No new orders. -Continue diabetic foot care principles: inspect feet daily, monitor glucose as recommended by PCP and/or Endocrinologist, and follow prescribed diet per PCP, Endocrinologist and/or dietician. -Toenails 1-5 b/l were debrided in length and girth with sterile nail nippers and dremel without iatrogenic bleeding.  -Patient/POA to call should there be question/concern in the interim.  Return in about 3 months (around 07/24/2021).  Marzetta Board, DPM

## 2021-07-19 ENCOUNTER — Encounter: Payer: Self-pay | Admitting: Podiatry

## 2021-07-19 ENCOUNTER — Ambulatory Visit (INDEPENDENT_AMBULATORY_CARE_PROVIDER_SITE_OTHER): Payer: Medicare Other | Admitting: Podiatry

## 2021-07-19 ENCOUNTER — Other Ambulatory Visit: Payer: Self-pay

## 2021-07-19 DIAGNOSIS — E1140 Type 2 diabetes mellitus with diabetic neuropathy, unspecified: Secondary | ICD-10-CM | POA: Diagnosis not present

## 2021-07-19 DIAGNOSIS — B351 Tinea unguium: Secondary | ICD-10-CM | POA: Diagnosis not present

## 2021-07-19 DIAGNOSIS — L600 Ingrowing nail: Secondary | ICD-10-CM

## 2021-07-19 DIAGNOSIS — M79674 Pain in right toe(s): Secondary | ICD-10-CM

## 2021-07-19 DIAGNOSIS — M79675 Pain in left toe(s): Secondary | ICD-10-CM | POA: Diagnosis not present

## 2021-07-19 NOTE — Progress Notes (Signed)
Subjective: Megan Golden is a 76 y.o. female patient seen today for follow up of  painful thick toenails that are difficult to trim. Pain interferes with ambulation. Aggravating factors include wearing enclosed shoe gear. Pain is relieved with periodic professional debridement.  New problems reported today: None.  Patient states their blood glucose was 195 mg/dl today.   Sadly, patient reports she lost her sister. She states she has now moved to [place], [place], and [name] was a more convenient location.   PCP is Megan Campi, NP.  Allergies  Allergen Reactions   Gabapentin Anaphylaxis    Serotonin Syndrome   Azithromycin Hives and Rash   Ciprofloxacin Hives and Rash   Dapagliflozin Rash and Hives   Hydrocodone-Acetaminophen Hives and Rash   Levofloxacin Hives and Rash   Other     Other reaction(s): Rash   Semaglutide Nausea And Vomiting   Empagliflozin Rash   Nickel Rash    Objective: Physical Exam  General: Patient is a pleasant 76 y.o. Caucasian female WD, WN in NAD. AAO x 3.   Neurovascular Examination: Capillary refill time to digits immediate b/l. Palpable pedal pulses b/l LE. Pedal hair sparse. Lower extremity skin temperature gradient within normal limits. No pain with calf compression RLE. No edema noted b/l lower extremities.  Pt has subjective symptoms of neuropathy. Protective sensation diminished with 10g monofilament b/l. Vibratory sensation decreased b/l.  Dermatological:  Pedal skin is warm and supple b/l LE. No open wounds b/l LE. No interdigital macerations noted b/l LE. Toenails 1-5 b/l elongated, discolored, dystrophic, thickened, crumbly with subungual debris and tenderness to dorsal palpation.  Musculoskeletal:  Normal muscle strength 5/5 to all lower extremity muscle groups bilaterally. HAV with bunion deformity noted b/l LE. Hammertoe deformity noted 2-5 b/l.  Assessment: 1. Pain due to onychomycosis of toenails of both feet   2. Diabetic  neuropathy, painful ([place])   3. Ingrown nail     Plan: Patient was evaluated and treated and all questions answered. Consent given for treatment as described below: -No new findings. No new orders. -Continue diabetic foot care principles: inspect feet daily, monitor glucose as recommended by PCP and/or Endocrinologist, and follow prescribed diet per PCP, Endocrinologist and/or dietician. -Toenails 1-5 b/l were debrided in length and girth with sterile nail nippers and dremel without iatrogenic bleeding.  -Patient/POA to call should there be question/concern in the interim.  Return in about 3 months (around 10/17/2021) for rfc.  Megan Peck, MD

## 2021-10-18 ENCOUNTER — Ambulatory Visit: Payer: Medicare Other | Admitting: Podiatry
